# Patient Record
Sex: Female | Born: 1990 | Race: White | Hispanic: No | Marital: Married | Smoking: Never smoker
Health system: Southern US, Community
[De-identification: ages and names within clinical notes are randomized; demographics above are authoritative.]

## PROBLEM LIST (undated history)

## (undated) ENCOUNTER — Inpatient Hospital Stay: Payer: Self-pay

## (undated) DIAGNOSIS — K219 Gastro-esophageal reflux disease without esophagitis: Secondary | ICD-10-CM

## (undated) DIAGNOSIS — F419 Anxiety disorder, unspecified: Secondary | ICD-10-CM

## (undated) DIAGNOSIS — F32A Depression, unspecified: Secondary | ICD-10-CM

## (undated) DIAGNOSIS — F329 Major depressive disorder, single episode, unspecified: Secondary | ICD-10-CM

## (undated) HISTORY — DX: Gastro-esophageal reflux disease without esophagitis: K21.9

## (undated) HISTORY — PX: NO PAST SURGERIES: SHX2092

## (undated) HISTORY — DX: Depression, unspecified: F32.A

## (undated) HISTORY — DX: Major depressive disorder, single episode, unspecified: F32.9

## (undated) HISTORY — DX: Anxiety disorder, unspecified: F41.9

## (undated) NOTE — ED Provider Notes (Signed)
 Formatting of this note is different from the original. EMERGENCY DEPARTMENT ENCOUNTER    CHIEF COMPLAINT   No chief complaint on file.  HPI   Janet Rivera is a 46 y.o. female who presents for alcohol intoxication.  Patient was out drinking at 7 mild post tonight with her family and took her evening medications and had a lot of nausea vomiting this evening upon return home.  Per EMS symptoms patient got home she started vomiting and was very drowsy therefore EMS was called.  PAST MEDICAL HISTORY   No past medical history on file.  SURGICAL HISTORY   No past surgical history on file.  CURRENT MEDICATIONS   No current facility-administered medications for this encounter.   No current outpatient medications on file.   ALLERGIES   Not on File  FAMILY HISTORY   No family history on file.  SOCIAL HISTORY   Social History   Socioeconomic History  ? Marital status: Not on file    Spouse name: Not on file  ? Number of children: Not on file  ? Years of education: Not on file  ? Highest education level: Not on file  Occupational History  ? Not on file  Tobacco Use  ? Smoking status: Not on file  ? Smokeless tobacco: Not on file  Substance and Sexual Activity  ? Alcohol use: Not on file  ? Drug use: Not on file  ? Sexual activity: Not on file  Other Topics Concern  ? Not on file  Social History Narrative  ? Not on file   Social Determinants of Health   Financial Resource Strain: Not on file  Food Insecurity: Not on file  Transportation Needs: Not on file  Social Connections: Not on file  Housing Stability: Not on file   REVIEW OF SYSTEMS   All systems reviewed and negative except for those mentioned above See HPI for further details.  PHYSICAL EXAM   VITAL SIGNS: BP 119/79 (BP Location: Right upper arm, Patient Position: Semi- fowlers)   Pulse 90   Resp 18   SpO2 100%  Constitutional:  Well developed, well nourished, no acute distress, non-toxic appearance.    Eyes:  PERRLA, EOMI,  conjunctiva and sclera clear, no discharge HENT: Atraumatic, Normocephalic, oral pharynx pink and moist, nares clear bilaterally  Neck:Normal inspection, normal range of motion, supple, no meningeal signs, no lymphadenopathy Respiratory:  Normal breath sounds, no respiratory distress, no wheezing, no chest tenderness.  Cardiovascular:  Normal heart rate, normal rhythm, no murmurs, no rubs, no gallops.  Abdomen:Normal abdomen exam, Bowel sounds normal, Soft, No tenderness, No masses, No pulsatile masses. No rebound or organomegly. Extremities:  Intact distal pulses, no edema, no tenderness, no cyanosis, no clubbing. Good range of motion in all major joints. No tenderness to palpation or major deformities noted.  Back: No midline or CVA tenderness.  Skin:  Warm, dry, no erythema, no rash.  Neurologic:  Awake and alert, gross normal cognition No focal deficits noted,  CN II-XII  Intact as tested  ED COURSE & MEDICAL DECISION MAKING   Pertinent labs & imaging studies reviewed. (See chart for details)  Patient is nontoxic having no respiratory depression.  Patient given fluids antiemetics.  Patient discharged home once clinically sober with sober driver.  She has had no trauma.  She is neurologically intact. Please note that this clinical encounter occurred during the global COVID-19 pandemic.  Clinical decision-making and criteria for admission, discharge and certain testing has necessarily changed given the  strain on the healthcare system.  In addition, current treatment and isolation precautions and directives are actively evolving as more information is being learned about this specific virus.  This may have an impact on treatment and clinical decision making specifically related to suspected viral infections.    CLINICAL IMPRESSION   1. Alcohol intoxication   Portions of this note may be dictated using Dragon Naturally Speaking voice recognition software.  Variances in  spelling and vocabulary are possible and unintentional.  Not all errors are caught/corrected.  Please notify the dino if any discrepancies are noted or if the meaning of any statement is not clear.  Morene RAMAN. Walden, DO 04/05/22 0330  Electronically signed by Morene RAMAN. Wiles, DO at 04/05/2022  3:30 AM EST

---

## 2015-01-17 DIAGNOSIS — F32A Depression, unspecified: Secondary | ICD-10-CM | POA: Insufficient documentation

## 2015-01-17 DIAGNOSIS — F329 Major depressive disorder, single episode, unspecified: Secondary | ICD-10-CM | POA: Insufficient documentation

## 2015-07-04 DIAGNOSIS — F411 Generalized anxiety disorder: Secondary | ICD-10-CM | POA: Insufficient documentation

## 2016-04-13 NOTE — L&D Delivery Note (Signed)
0345 In room to see patient, reports pelvic pressure with the urge to push. SVE: 10/100/+2, vertex. Effective maternal pushing efforts noted.  Spontaneous vaginal delivery of liveborn female patient at 65 in compound presentation. Infant immediately to maternal abdomen. Delayed cord clamping. Three (3) vessel cord. Cord blood collected. APGARS: 8, 9. Weight pending. Receiving nurse at bedside for birth.  Spontaneous delivery of placenta at 0453. Pitocin infusing. Left labial laceration and first degree perineal laceration repaired with 3-0 vicryl rapide under epidural anesthesia, hemostatic. Uterus firm with massage. 800 mcg cytotec placed rectally. Uterus firm. Rubra moderate. Vault check completed. Counts correct x 2. EBL: 500 ml.   Initiate routine postpartum care and orders. Mom to postpartum.  Baby to Couplet care / Skin to Skin.  FOB present at bedside for birth.    Gunnar Bulla, CNM 12/26/2016, 5:21 AM

## 2016-04-17 ENCOUNTER — Ambulatory Visit (INDEPENDENT_AMBULATORY_CARE_PROVIDER_SITE_OTHER): Payer: BLUE CROSS/BLUE SHIELD | Admitting: Certified Nurse Midwife

## 2016-04-17 ENCOUNTER — Encounter: Payer: Self-pay | Admitting: Certified Nurse Midwife

## 2016-04-17 VITALS — BP 146/87 | HR 97 | Ht 62.0 in | Wt 165.2 lb

## 2016-04-17 DIAGNOSIS — K259 Gastric ulcer, unspecified as acute or chronic, without hemorrhage or perforation: Secondary | ICD-10-CM | POA: Insufficient documentation

## 2016-04-17 DIAGNOSIS — N912 Amenorrhea, unspecified: Secondary | ICD-10-CM | POA: Insufficient documentation

## 2016-04-17 DIAGNOSIS — Z7689 Persons encountering health services in other specified circumstances: Secondary | ICD-10-CM | POA: Diagnosis not present

## 2016-04-17 DIAGNOSIS — Z0189 Encounter for other specified special examinations: Secondary | ICD-10-CM

## 2016-04-17 NOTE — Progress Notes (Signed)
OB/GYN  NOTE:  Subjective:       Janet Rivera is a 26 y.o. G0P0000 female who presents for a second opinion and to establish care.   Last week, [name] [facility]. Both her urine pregnancy test and blood work were negative. A transvaginal US was performed since she reported missed menses and abdominal cramping.   Her US showed enlarged ovaries with several follicles and she was diagnosed with PCOS and referred to an infertility specialist.   After leaving the office, the patient began to research online and presents today requesting further evaluation and lab work. She also spoke with her mother who has PCOS. [name] vehemently denies all symptoms of hyperandrogenism that are associated with PCOS.    Gynecologic History Patient's last menstrual period was 02/24/2016 (exact date).   Contraception: none; Stopped OCP on 03/09/2016.  Obstetric History OB History  Gravida Para Term Preterm AB Living  0 0 0 0 0 0  SAB TAB Ectopic Multiple Live Births  0 0 0 0 0        Past Medical History:  Diagnosis Date  . Anxiety   . Depression     History reviewed. No pertinent surgical history.  No current outpatient prescriptions on file prior to visit.   No current facility-administered medications on file prior to visit.     Not on File  Social History   Social History  . Marital status: Married    Spouse name: N/A  . Number of children: N/A  . Years of education: N/A   Occupational History  . Not on file.   Social History Main Topics  . Smoking status: Never Smoker  . Smokeless tobacco: Never Used  . Alcohol use No  . Drug use: No  . Sexual activity: Yes    Birth control/ protection: None   Other Topics Concern  . Not on file   Social History Narrative  . No narrative on file    Family History  Problem Relation Age of Onset  . Thyroid disease Mother   . Rheum arthritis Father   . Diabetes Father   . Diabetes Maternal  Grandmother   . Breast cancer Paternal Grandmother   . Rheum arthritis Paternal Grandfather     The following portions of the patient's history were reviewed and updated as appropriate: allergies, current medications, past family history, past medical history, past social history, past surgical history and problem list.  Review of Systems Constitutional: negative Respiratory: negative Cardiovascular: negative Gastrointestinal: negative Genitourinary:negative   Objective:   BP (!) 146/87   Pulse 97   Ht 5\' 2"  (1.575 m)   Wt 165 lb 3 oz (74.9 kg)   LMP 02/24/2016 (Exact Date)   BMI 30.21 kg/m   Alert and oriented x 4  PE not indicated  Assessment   1. Encounter to establish care  2. Encounter for laboratory test  Plan:   1. Schedule fasting lab work in one (1) week  2. Schedule results review visit in two (2) weeks   Gunnar BullaJenkins Michelle Crystal Ellwood, CNM

## 2016-04-17 NOTE — Progress Notes (Signed)
Pt is here for a second opinion on dx of PCOS from [facility] 1 week ago. Ultrasound done.

## 2016-04-20 ENCOUNTER — Other Ambulatory Visit: Payer: BLUE CROSS/BLUE SHIELD

## 2016-04-20 DIAGNOSIS — Z0189 Encounter for other specified special examinations: Secondary | ICD-10-CM

## 2016-04-20 DIAGNOSIS — Z7689 Persons encountering health services in other specified circumstances: Secondary | ICD-10-CM

## 2016-04-21 ENCOUNTER — Telehealth: Payer: Self-pay | Admitting: Certified Nurse Midwife

## 2016-04-21 ENCOUNTER — Other Ambulatory Visit: Payer: Self-pay | Admitting: Certified Nurse Midwife

## 2016-04-21 DIAGNOSIS — N912 Amenorrhea, unspecified: Secondary | ICD-10-CM

## 2016-04-21 LAB — COMPREHENSIVE METABOLIC PANEL
ALBUMIN: 4.3 g/dL (ref 3.5–5.5)
ALK PHOS: 82 IU/L (ref 39–117)
ALT: 26 IU/L (ref 0–32)
AST: 25 IU/L (ref 0–40)
Albumin/Globulin Ratio: 1.5 (ref 1.2–2.2)
BUN/Creatinine Ratio: 17 (ref 9–23)
BUN: 12 mg/dL (ref 6–20)
Bilirubin Total: 0.3 mg/dL (ref 0.0–1.2)
CO2: 21 mmol/L (ref 18–29)
CREATININE: 0.7 mg/dL (ref 0.57–1.00)
Calcium: 9.2 mg/dL (ref 8.7–10.2)
Chloride: 100 mmol/L (ref 96–106)
GFR calc Af Amer: 139 mL/min/{1.73_m2} (ref >59)
GFR calc non Af Amer: 121 mL/min/{1.73_m2} (ref >59)
GLUCOSE: 99 mg/dL (ref 65–99)
Globulin, Total: 2.9 g/dL (ref 1.5–4.5)
Potassium: 3.7 mmol/L (ref 3.5–5.2)
Sodium: 138 mmol/L (ref 134–144)
Total Protein: 7.2 g/dL (ref 6.0–8.5)

## 2016-04-21 LAB — SPECIMEN STATUS

## 2016-04-21 LAB — PROLACTIN: PROLACTIN: 16.3 ng/mL (ref 4.8–23.3)

## 2016-04-21 LAB — TESTOSTERONE, FREE, TOTAL, SHBG
SEX HORMONE BINDING: 76.5 nmol/L (ref 24.6–122.0)
Testosterone, Free: 1.7 pg/mL (ref 0.0–4.2)
Testosterone: 54 ng/dL — ABNORMAL HIGH (ref 8–48)

## 2016-04-21 LAB — LIPID PANEL
CHOL/HDL RATIO: 2.4 ratio (ref 0.0–4.4)
Cholesterol, Total: 175 mg/dL (ref 100–199)
HDL: 74 mg/dL (ref >39)
LDL Calculated: 83 mg/dL (ref 0–99)
Triglycerides: 90 mg/dL (ref 0–149)
VLDL Cholesterol Cal: 18 mg/dL (ref 5–40)

## 2016-04-21 LAB — TSH: TSH: 2.97 u[IU]/mL (ref 0.450–4.500)

## 2016-04-21 LAB — BETA HCG QUANT (REF LAB): HCG QUANT: 117 m[IU]/mL

## 2016-04-21 LAB — FSH/LH: FSH: 0.6 m[IU]/mL

## 2016-04-21 LAB — DHEA-SULFATE: DHEA-SO4: 148 ug/dL (ref 84.8–378.0)

## 2016-04-21 LAB — INSULIN, RANDOM: INSULIN: 33 u[IU]/mL — AB (ref 2.6–24.9)

## 2016-04-21 NOTE — Telephone Encounter (Signed)
Called pt, full name and date of birth verified. Notified patient of positive Beta HCG and need for repeat draw Wednesday, 04/22/2016. Pt verbalized understanding.    Gunnar BullaJenkins Michelle Lynn Recendiz, CNM

## 2016-04-22 ENCOUNTER — Other Ambulatory Visit: Payer: BLUE CROSS/BLUE SHIELD

## 2016-04-22 DIAGNOSIS — N912 Amenorrhea, unspecified: Secondary | ICD-10-CM

## 2016-04-23 ENCOUNTER — Telehealth: Payer: Self-pay | Admitting: Certified Nurse Midwife

## 2016-04-23 DIAGNOSIS — N912 Amenorrhea, unspecified: Secondary | ICD-10-CM

## 2016-04-23 LAB — BETA HCG QUANT (REF LAB): hCG Quant: 284 m[IU]/mL

## 2016-04-23 NOTE — Telephone Encounter (Signed)
Called pt, verified full name and date of birth.   Lab results given.   Beta HCG=284, previously 117. Repeat Friday. Pt verbalized understanding.    Gunnar BullaJenkins Michelle Shunte Senseney, CNM

## 2016-04-24 ENCOUNTER — Other Ambulatory Visit: Payer: BLUE CROSS/BLUE SHIELD

## 2016-04-24 DIAGNOSIS — N912 Amenorrhea, unspecified: Secondary | ICD-10-CM

## 2016-04-25 LAB — BETA HCG QUANT (REF LAB): hCG Quant: 762 m[IU]/mL

## 2016-04-27 ENCOUNTER — Encounter: Payer: BLUE CROSS/BLUE SHIELD | Admitting: Certified Nurse Midwife

## 2016-04-27 ENCOUNTER — Other Ambulatory Visit: Payer: Self-pay | Admitting: Certified Nurse Midwife

## 2016-04-27 ENCOUNTER — Ambulatory Visit (INDEPENDENT_AMBULATORY_CARE_PROVIDER_SITE_OTHER): Payer: BLUE CROSS/BLUE SHIELD | Admitting: Certified Nurse Midwife

## 2016-04-27 ENCOUNTER — Encounter: Payer: Self-pay | Admitting: Certified Nurse Midwife

## 2016-04-27 VITALS — BP 127/80 | HR 91 | Ht 62.0 in | Wt 170.0 lb

## 2016-04-27 DIAGNOSIS — Z3201 Encounter for pregnancy test, result positive: Secondary | ICD-10-CM | POA: Diagnosis not present

## 2016-04-27 DIAGNOSIS — N926 Irregular menstruation, unspecified: Secondary | ICD-10-CM

## 2016-04-27 LAB — POCT URINALYSIS DIPSTICK
Glucose, UA: NEGATIVE
PH UA: 5
Protein, UA: NEGATIVE
Spec Grav, UA: 1.025

## 2016-04-27 LAB — POCT URINE PREGNANCY: PREG TEST UR: POSITIVE — AB

## 2016-04-27 MED ORDER — VENLAFAXINE HCL ER 75 MG PO CP24
75.0000 mg | ORAL_CAPSULE | Freq: Every day | ORAL | 0 refills | Status: DC
Start: 2016-04-27 — End: 2016-05-06

## 2016-04-27 MED ORDER — VENLAFAXINE HCL ER 37.5 MG PO CP24: 37.5000 mg | ORAL_CAPSULE | Freq: Every day | ORAL | 0 refills | Status: DC

## 2016-04-27 NOTE — Progress Notes (Signed)
OB/GYN CONFERENCE NOTE:  Subjective:       Janet Rivera is a 26 y.o. G0P0000 female who presents for a conference appointment to review lab results. Her mother is also in attendance.   [name] originally presented for a second opinion on her PCOS diagnosis. Lab work was ordered and a positive Beta HCG was found.  She reports increased hunger, nausea, bloating, and breast tenderness. Denies difficulty breathing or respiratory distress, chest pain, abdominal pain, vaginal bleeding, and leg pain or swelling.   Pt currently taking Effexor 150 mg for depression and anxiety, questions safety in pregnancy.    Gynecologic History Patient's last menstrual period was 02/24/2016 (exact date).   Contraception: none  Obstetric History OB History  Gravida Para Term Preterm AB Living  0 0 0 0 0 0  SAB TAB Ectopic Multiple Live Births  0 0 0 0 0        Past Medical History:  Diagnosis Date  . Anxiety   . Depression     History reviewed. No pertinent surgical history.  Current Outpatient Prescriptions on File Prior to Visit  Medication Sig Dispense Refill  . LORazepam (ATIVAN) 0.5 MG tablet Take by mouth.    . pantoprazole (PROTONIX) 40 MG tablet Take by mouth.    . venlafaxine XR (EFFEXOR-XR) 150 MG 24 hr capsule Take by mouth.     No current facility-administered medications on file prior to visit.     Not on File  Social History   Social History  . Marital status: Married    Spouse name: N/A  . Number of children: N/A  . Years of education: N/A   Occupational History  . Not on file.   Social History Main Topics  . Smoking status: Never Smoker  . Smokeless tobacco: Never Used  . Alcohol use No  . Drug use: No  . Sexual activity: Yes    Birth control/ protection: None   Other Topics Concern  . Not on file   Social History Narrative  . No narrative on file    Family History  Problem Relation Age of Onset  . Thyroid disease Mother   . Rheum arthritis  Father   . Diabetes Father   . Diabetes Maternal Grandmother   . Breast cancer Paternal Grandmother   . Rheum arthritis Paternal Grandfather     The following portions of the patient's history were reviewed and updated as appropriate: allergies, current medications, past family history, past medical history, past social history, past surgical history and problem list.  Review of Systems Pertinent items are noted in HPI.   Objective:   BP 127/80   Pulse 91   Ht 5\' 2"  (1.575 m)   Wt 170 lb (77.1 kg)   LMP 02/24/2016 (Exact Date)   BMI 31.09 kg/m   Positive UPT  Assessment/Plan:   Patient Active Problem List   Diagnosis Date Noted  . Amenorrhea 04/17/2016  . Ulcer, stomach peptic 04/17/2016  . Anxiety state 07/04/2015  . Depression 01/17/2015        1. Lab results reviewed, consistent with pregnancy and PCOS  2. Beta HCG then f/u US for dating  3. New OB Packet given  4. Reviewed red flag symptoms and reasons to call  5. Effexor tapering schedule given, pt verbalized understanding.    Gunnar BullaJenkins Michelle Dailan Pfalzgraf, CNM

## 2016-04-27 NOTE — Progress Notes (Signed)
Pt states she is here to review labs.

## 2016-04-28 ENCOUNTER — Ambulatory Visit (INDEPENDENT_AMBULATORY_CARE_PROVIDER_SITE_OTHER): Payer: BLUE CROSS/BLUE SHIELD

## 2016-04-28 ENCOUNTER — Other Ambulatory Visit: Payer: Self-pay | Admitting: Certified Nurse Midwife

## 2016-04-28 ENCOUNTER — Telehealth: Payer: Self-pay | Admitting: Certified Nurse Midwife

## 2016-04-28 DIAGNOSIS — N926 Irregular menstruation, unspecified: Secondary | ICD-10-CM

## 2016-04-28 LAB — BETA HCG QUANT (REF LAB): hCG Quant: 2607 m[IU]/mL

## 2016-04-28 NOTE — Telephone Encounter (Signed)
PT WAS HERE YESTERDAY FOR SOME BLOOD WORK AND SHE WAS WANTING TO KNOW THE RESULTS IF THEY WERE BACK.

## 2016-04-28 NOTE — Telephone Encounter (Signed)
Spoke with pt. Results and instructions given.

## 2016-04-28 NOTE — Telephone Encounter (Signed)
Please advise 

## 2016-04-28 NOTE — Telephone Encounter (Signed)
Please let her know that her results are back, her Beta was 2607. She should schedule an US for viability and dating and then we will decide on Nurse Intake meeting afterwards.   Thanks, JML

## 2016-05-01 ENCOUNTER — Other Ambulatory Visit: Payer: Self-pay | Admitting: Certified Nurse Midwife

## 2016-05-01 DIAGNOSIS — Z369 Encounter for antenatal screening, unspecified: Secondary | ICD-10-CM

## 2016-05-05 ENCOUNTER — Encounter: Payer: Self-pay | Admitting: Certified Nurse Midwife

## 2016-05-05 ENCOUNTER — Other Ambulatory Visit: Payer: Self-pay | Admitting: Certified Nurse Midwife

## 2016-05-06 ENCOUNTER — Telehealth: Payer: Self-pay

## 2016-05-06 ENCOUNTER — Other Ambulatory Visit: Payer: Self-pay | Admitting: Certified Nurse Midwife

## 2016-05-06 MED ORDER — VENLAFAXINE HCL ER 37.5 MG PO CP24: 37.5000 mg | ORAL_CAPSULE | Freq: Every day | ORAL | 0 refills | Status: DC

## 2016-05-06 MED ORDER — VENLAFAXINE HCL ER 75 MG PO CP24: 75.0000 mg | ORAL_CAPSULE | Freq: Every day | ORAL | 0 refills | Status: DC

## 2016-05-06 NOTE — Telephone Encounter (Signed)
Janet DusterMichelle, this pt is calling because she was under the impression that the taper dose of effexor was being sent to her pharmacy, I looked in the chart and it looks like you ordered the medications but pended the orders rather than send them. Can you please go back in and send the medications (it won't let me as you have the orders pending) or if you want the pt to hold off on taking the medication please advise.

## 2016-05-06 NOTE — Addendum Note (Signed)
Addended by: Shaune SpittleLAWHORN, Sanay Belmar M on: 05/06/2016 12:46 PM   Modules accepted: Orders

## 2016-05-07 ENCOUNTER — Encounter: Payer: Self-pay | Admitting: Obstetrics and Gynecology

## 2016-05-07 ENCOUNTER — Encounter: Payer: Self-pay | Admitting: Certified Nurse Midwife

## 2016-05-07 ENCOUNTER — Telehealth: Payer: Self-pay | Admitting: Certified Nurse Midwife

## 2016-05-07 ENCOUNTER — Other Ambulatory Visit: Payer: Self-pay

## 2016-05-07 DIAGNOSIS — F329 Major depressive disorder, single episode, unspecified: Secondary | ICD-10-CM

## 2016-05-07 DIAGNOSIS — F419 Anxiety disorder, unspecified: Principal | ICD-10-CM

## 2016-05-07 MED ORDER — SERTRALINE HCL 50 MG PO TABS: 50.0000 mg | ORAL_TABLET | Freq: Every day | ORAL | 11 refills | Status: DC

## 2016-05-07 NOTE — Telephone Encounter (Signed)
Called pt, verified full name and date of birth.   Reports increased anxiety and moodiness with Effexor tapering. Denies suicidal or homicidal ideations.   Reviewed revamp tapering schedule:   Week 1: Effexor 75 mg and Zoloft 50 mg daily  Week 2: Effexor 37.5 mg and Zoloft 50 mg daily  Week 3: Effexor 37.5 mg every other day and Zoloft 50 mg daily  Week 4: Zoloft 50 mg daily  Pt to disregard MyChart message from Dorita Fray. [place], CMA.  Encouraged finding a talk therapist during pregnancy.   Advised immediate ED evaluation for suicidal or homicidal ideations.   Call with any further needs, questions or concerns.    Gunnar BullaJenkins Michelle Arneda Sappington, CNM

## 2016-05-07 NOTE — Telephone Encounter (Signed)
Please call in Zoloft 50 mg for the patient. Also, would you see if she has or would like to see a talk therapist?  Thanks, JML

## 2016-05-08 ENCOUNTER — Telehealth: Payer: Self-pay | Admitting: Certified Nurse Midwife

## 2016-05-08 DIAGNOSIS — O219 Vomiting of pregnancy, unspecified: Secondary | ICD-10-CM

## 2016-05-08 MED ORDER — DOXYLAMINE-PYRIDOXINE 10-10 MG PO TBEC: DELAYED_RELEASE_TABLET | ORAL | 3 refills | Status: DC

## 2016-05-08 NOTE — Telephone Encounter (Signed)
Called pt she states that she is having trouble controlling nausea and vomiting. PT states that she has tried ginger tea, an old rx for Zofran that she had at home as well as saltines. Advised pt on eaiting crackers before leaving bed in the am, also advised on Unisom and vitamin b-6. RX sent in for diclegis advised pt that this would likely require prior auth, pt gave verbal understanding.

## 2016-05-08 NOTE — Telephone Encounter (Signed)
Can you check on this? Who gave her Zofran? Thanks, JML

## 2016-05-08 NOTE — Telephone Encounter (Signed)
Pt called and she wanted to know if something else could be called in for her morning sickness, she was already given Zofran but its not working. She would like it sent to the [facility] in [place] if it can be called in.

## 2016-05-12 ENCOUNTER — Telehealth: Payer: Self-pay | Admitting: Certified Nurse Midwife

## 2016-05-12 NOTE — Telephone Encounter (Signed)
Advised pt on Friday that she should take vitamin B6 25mg  tid and Unisom 10mg  nightly while we were awaiting the prior auth of Diclegis advised pt at that time that this was a lengthy process. (see mychart message)   Sent pt a mychart message today reiterating that the prior Berkley Harveyauth is being addressed, however does take time especially as the rx was initially sent on a Friday and the weekend adds more time. Also questioned pt as to whether or not she has tried the B6 and Unisom without relief. If she has with no relief she needs to make us aware as she may need a different medication altogether.

## 2016-05-12 NOTE — Telephone Encounter (Signed)
Okie, is this the patient you contacted about morning sickness? JML

## 2016-05-12 NOTE — Telephone Encounter (Signed)
Pt called and a prior auth needed to be done for the diclegis and she hasn't heard anything about her RX being ready from pharmacy nor the prio auth, so she is checking to see if its been done. Not sure what nurse to send this to since a different nurse works with you and I have no idea when the prior auth was sent it, so I sent to you.

## 2016-05-13 ENCOUNTER — Ambulatory Visit (INDEPENDENT_AMBULATORY_CARE_PROVIDER_SITE_OTHER): Payer: BLUE CROSS/BLUE SHIELD

## 2016-05-13 DIAGNOSIS — Z369 Encounter for antenatal screening, unspecified: Secondary | ICD-10-CM

## 2016-05-15 ENCOUNTER — Other Ambulatory Visit: Payer: BLUE CROSS/BLUE SHIELD

## 2016-05-18 ENCOUNTER — Ambulatory Visit (INDEPENDENT_AMBULATORY_CARE_PROVIDER_SITE_OTHER): Payer: BLUE CROSS/BLUE SHIELD | Admitting: Certified Nurse Midwife

## 2016-05-18 ENCOUNTER — Other Ambulatory Visit: Payer: Self-pay | Admitting: Certified Nurse Midwife

## 2016-05-18 ENCOUNTER — Telehealth: Payer: Self-pay

## 2016-05-18 VITALS — BP 137/82 | HR 92 | Ht 63.0 in | Wt 177.1 lb

## 2016-05-18 DIAGNOSIS — O219 Vomiting of pregnancy, unspecified: Secondary | ICD-10-CM

## 2016-05-18 DIAGNOSIS — Z113 Encounter for screening for infections with a predominantly sexual mode of transmission: Secondary | ICD-10-CM

## 2016-05-18 DIAGNOSIS — T7589XA Other specified effects of external causes, initial encounter: Secondary | ICD-10-CM

## 2016-05-18 DIAGNOSIS — Z3401 Encounter for supervision of normal first pregnancy, first trimester: Secondary | ICD-10-CM

## 2016-05-18 DIAGNOSIS — Z1389 Encounter for screening for other disorder: Secondary | ICD-10-CM

## 2016-05-18 MED ORDER — PROMETHAZINE HCL 25 MG PO TABS: 25.0000 mg | ORAL_TABLET | Freq: Four times a day (QID) | ORAL | 1 refills | Status: DC | PRN

## 2016-05-18 NOTE — Telephone Encounter (Signed)
Pt aware diclegis denied. Pt has tried b6 and unison x 1 weeks mild relief. Per JML will send in phenergan 25 mg q 6prn. Pt is to let us know if this doesn't help, we will send in phenergan rectally.

## 2016-05-18 NOTE — Patient Instructions (Signed)
Morning Sickness Morning sickness is when you feel sick to your stomach (nauseous) during pregnancy. You may feel sick to your stomach and throw up (vomit). You may feel sick in the morning, but you can feel this way any time of day. Some women feel very sick to their stomach and cannot stop throwing up (hyperemesis gravidarum). Follow these instructions at home:  Only take medicines as told by your doctor.  Take multivitamins as told by your doctor. Taking multivitamins before getting pregnant can stop or lessen the harshness of morning sickness.  Eat dry toast or unsalted crackers before getting out of bed.  Eat 5 to 6 small meals a day.  Eat dry and bland foods like rice and baked potatoes.  Do not drink liquids with meals. Drink between meals.  Do not eat greasy, fatty, or spicy foods.  Have someone cook for you if the smell of food causes you to feel sick or throw up.  If you feel sick to your stomach after taking prenatal vitamins, take them at night or with a snack.  Eat protein when you need a snack (nuts, yogurt, cheese).  Eat unsweetened gelatins for dessert.  Wear a bracelet used for sea sickness (acupressure wristband).  Go to a doctor that puts thin needles into certain body points (acupuncture) to improve how you feel.  Do not smoke.  Use a humidifier to keep the air in your house free of odors.  Get lots of fresh air. Contact a doctor if:  You need medicine to feel better.  You feel dizzy or lightheaded.  You are losing weight. Get help right away if:  You feel very sick to your stomach and cannot stop throwing up.  You pass out (faint). This information is not intended to replace advice given to you by your health care provider. Make sure you discuss any questions you have with your health care provider. Document Released: 05/07/2004 Document Revised: 09/05/2015 Document Reviewed: 09/14/2012 Elsevier Interactive Patient Education  2017 Elsevier  Inc. Minor Illnesses and Medications in Pregnancy  Cold/Flu:  Sudafed for congestion- Robitussin (plain) for cough- Tylenol for discomfort.  Please follow the directions on the label.  Try not to take any more than needed.  OTC Saline nasal spray and air humidifier or cool-mist  Vaporizer to sooth nasal irritation and to loosen congestion.  It is also important to increase intake of non carbonated fluids, especially if you have a fever.  Constipation:  Colace-2 capsules at bedtime; Metamucil- follow directions on label; Senokot- 1 tablet at bedtime.  Any one of these medications can be used.  It is also very important to increase fluids and fruits along with regular exercise.  If problem persists please call the office.  Diarrhea:  Kaopectate as directed on the label.  Eat a bland diet and increase fluids.  Avoid highly seasoned foods.  Headache:  Tylenol 1 or 2 tablets every 3-4 hours as needed  Indigestion:  Maalox, Mylanta, Tums or Rolaids- as directed on label.  Also try to eat small meals and avoid fatty, greasy or spicy foods.  Nausea with or without Vomiting:  Nausea in pregnancy is caused by increased levels of hormones in the body which influence the digestive system and cause irritation when stomach acids accumulate.  Symptoms usually subside after 1st trimester of pregnancy.  Try the following: 1. Keep saltines, graham crackers or dry toast by your bed to eat upon awakening. 2. Don't let your stomach get empty.  Try to  eat 5-6 small meals per day instead of 3 large ones. 3. Avoid greasy fatty or highly seasoned foods.  4. Take OTC Unisom 1 tablet at bed time along with OTC Vitamin B6 25-50 mg 3 times per day.    If nausea continues with vomiting and you are unable to keep down food and fluids you may need a prescription medication.  Please notify your provider.   Sore throat:  Chloraseptic spray, throat lozenges and or plain Tylenol.  Vaginal Yeast Infection:  OTC Monistat for 7  days as directed on label.  If symptoms do not resolve within a week notify provider.  If any of the above problems do not subside with recommended treatment please call the office for further assistance.   Do not take Aspirin, Advil, Motrin or Ibuprofen.  * * OTC= Over the counter First Trimester of Pregnancy The first trimester of pregnancy is from week 1 until the end of week 12 (months 1 through 3). A week after a sperm fertilizes an egg, the egg will implant on the wall of the uterus. This embryo will begin to develop into a baby. Genes from you and your partner are forming the baby. The female genes determine whether the baby is a boy or a girl. At 6-8 weeks, the eyes and face are formed, and the heartbeat can be seen on ultrasound. At the end of 12 weeks, all the baby's organs are formed.  Now that you are pregnant, you will want to do everything you can to have a healthy baby. Two of the most important things are to get good prenatal care and to follow your health care provider's instructions. Prenatal care is all the medical care you receive before the baby's birth. This care will help prevent, find, and treat any problems during the pregnancy and childbirth. BODY CHANGES Your body goes through many changes during pregnancy. The changes vary from woman to woman.   You may gain or lose a couple of pounds at first.  You may feel sick to your stomach (nauseous) and throw up (vomit). If the vomiting is uncontrollable, call your health care provider.  You may tire easily.  You may develop headaches that can be relieved by medicines approved by your health care provider.  You may urinate more often. Painful urination may mean you have a bladder infection.  You may develop heartburn as a result of your pregnancy.  You may develop constipation because certain hormones are causing the muscles that push waste through your intestines to slow down.  You may develop hemorrhoids or swollen, bulging  veins (varicose veins).  Your breasts may begin to grow larger and become tender. Your nipples may stick out more, and the tissue that surrounds them (areola) may become darker.  Your gums may bleed and may be sensitive to brushing and flossing.  Dark spots or blotches (chloasma, mask of pregnancy) may develop on your face. This will likely fade after the baby is born.  Your menstrual periods will stop.  You may have a loss of appetite.  You may develop cravings for certain kinds of food.  You may have changes in your emotions from day to day, such as being excited to be pregnant or being concerned that something may go wrong with the pregnancy and baby.  You may have more vivid and strange dreams.  You may have changes in your hair. These can include thickening of your hair, rapid growth, and changes in texture. Some women also  have hair loss during or after pregnancy, or hair that feels dry or thin. Your hair will most likely return to normal after your baby is born. WHAT TO EXPECT AT YOUR PRENATAL VISITS During a routine prenatal visit:  You will be weighed to make sure you and the baby are growing normally.  Your blood pressure will be taken.  Your abdomen will be measured to track your baby's growth.  The fetal heartbeat will be listened to starting around week 10 or 12 of your pregnancy.  Test results from any previous visits will be discussed. Your health care provider may ask you:  How you are feeling.  If you are feeling the baby move.  If you have had any abnormal symptoms, such as leaking fluid, bleeding, severe headaches, or abdominal cramping.  If you are using any tobacco products, including cigarettes, chewing tobacco, and electronic cigarettes.  If you have any questions. Other tests that may be performed during your first trimester include:  Blood tests to find your blood type and to check for the presence of any previous infections. They will also be used  to check for low iron levels (anemia) and Rh antibodies. Later in the pregnancy, blood tests for diabetes will be done along with other tests if problems develop.  Urine tests to check for infections, diabetes, or protein in the urine.  An ultrasound to confirm the proper growth and development of the baby.  An amniocentesis to check for possible genetic problems.  Fetal screens for spina bifida and Down syndrome.  You may need other tests to make sure you and the baby are doing well.  HIV (human immunodeficiency virus) testing. Routine prenatal testing includes screening for HIV, unless you choose not to have this test. HOME CARE INSTRUCTIONS  Medicines   Follow your health care provider's instructions regarding medicine use. Specific medicines may be either safe or unsafe to take during pregnancy.  Take your prenatal vitamins as directed.  If you develop constipation, try taking a stool softener if your health care provider approves. Diet   Eat regular, well-balanced meals. Choose a variety of foods, such as meat or vegetable-based protein, fish, milk and low-fat dairy products, vegetables, fruits, and whole grain breads and cereals. Your health care provider will help you determine the amount of weight gain that is right for you.  Avoid raw meat and uncooked cheese. These carry germs that can cause birth defects in the baby.  Eating four or five small meals rather than three large meals a day may help relieve nausea and vomiting. If you start to feel nauseous, eating a few soda crackers can be helpful. Drinking liquids between meals instead of during meals also seems to help nausea and vomiting.  If you develop constipation, eat more high-fiber foods, such as fresh vegetables or fruit and whole grains. Drink enough fluids to keep your urine clear or pale yellow. Activity and Exercise   Exercise only as directed by your health care provider. Exercising will help you:  Control your  weight.  Stay in shape.  Be prepared for labor and delivery.  Experiencing pain or cramping in the lower abdomen or low back is a good sign that you should stop exercising. Check with your health care provider before continuing normal exercises.  Try to avoid standing for long periods of time. Move your legs often if you must stand in one place for a long time.  Avoid heavy lifting.  Wear low-heeled shoes, and practice good  posture.  You may continue to have sex unless your health care provider directs you otherwise. Relief of Pain or Discomfort   Wear a good support bra for breast tenderness.   Take warm sitz baths to soothe any pain or discomfort caused by hemorrhoids. Use hemorrhoid cream if your health care provider approves.   Rest with your legs elevated if you have leg cramps or low back pain.  If you develop varicose veins in your legs, wear support hose. Elevate your feet for 15 minutes, 3-4 times a day. Limit salt in your diet. Prenatal Care   Schedule your prenatal visits by the twelfth week of pregnancy. They are usually scheduled monthly at first, then more often in the last 2 months before delivery.  Write down your questions. Take them to your prenatal visits.  Keep all your prenatal visits as directed by your health care provider. Safety   Wear your seat belt at all times when driving.  Make a list of emergency phone numbers, including numbers for family, friends, the hospital, and police and fire departments. General Tips   Ask your health care provider for a referral to a local prenatal education class. Begin classes no later than at the beginning of month 6 of your pregnancy.  Ask for help if you have counseling or nutritional needs during pregnancy. Your health care provider can offer advice or refer you to specialists for help with various needs.  Do not use hot tubs, steam rooms, or saunas.  Do not douche or use tampons or scented sanitary  pads.  Do not cross your legs for long periods of time.  Avoid cat litter boxes and soil used by cats. These carry germs that can cause birth defects in the baby and possibly loss of the fetus by miscarriage or stillbirth.  Avoid all smoking, herbs, alcohol, and medicines not prescribed by your health care provider. Chemicals in these affect the formation and growth of the baby.  Do not use any tobacco products, including cigarettes, chewing tobacco, and electronic cigarettes. If you need help quitting, ask your health care provider. You may receive counseling support and other resources to help you quit.  Schedule a dentist appointment. At home, brush your teeth with a soft toothbrush and be gentle when you floss. SEEK MEDICAL CARE IF:   You have dizziness.  You have mild pelvic cramps, pelvic pressure, or nagging pain in the abdominal area.  You have persistent nausea, vomiting, or diarrhea.  You have a bad smelling vaginal discharge.  You have pain with urination.  You notice increased swelling in your face, hands, legs, or ankles. SEEK IMMEDIATE MEDICAL CARE IF:   You have a fever.  You are leaking fluid from your vagina.  You have spotting or bleeding from your vagina.  You have severe abdominal cramping or pain.  You have rapid weight gain or loss.  You vomit blood or material that looks like coffee grounds.  You are exposed to [place] measles and have never had them.  You are exposed to fifth disease or chickenpox.  You develop a severe headache.  You have shortness of breath.  You have any kind of trauma, such as from a fall or a car accident. This information is not intended to replace advice given to you by your health care provider. Make sure you discuss any questions you have with your health care provider. Document Released: 03/24/2001 Document Revised: 04/20/2014 Document Reviewed: 02/07/2013 Elsevier Interactive Patient Education  2017 Elsevier  Inc. How a Baby Grows During Pregnancy Introduction Pregnancy begins when a female's sperm enters a female's egg (fertilization). This happens in one of the tubes (fallopian tubes) that connect the ovaries to the womb (uterus). The fertilized egg is called an embryo until it reaches 10 weeks. From 10 weeks until birth, it is called a fetus. The fertilized egg moves down the fallopian tube to the uterus. Then it implants into the lining of the uterus and begins to grow. The developing fetus receives oxygen and nutrients through the pregnant woman's bloodstream and the tissues that grow (placenta) to support the fetus. The placenta is the life support system for the fetus. It provides nutrition and removes waste. Learning as much as you can about your pregnancy and how your baby is developing can help you enjoy the experience. It can also make you aware of when there might be a problem and when to ask questions. How long does a typical pregnancy last? A pregnancy usually lasts 280 days, or about 40 weeks. Pregnancy is divided into three trimesters:  First trimester: 0-13 weeks.  Second trimester: 14-27 weeks.  Third trimester: 28-40 weeks. The day when your baby is considered ready to be born (full term) is your estimated date of delivery. How does my baby develop month by month? First month  The fertilized egg attaches to the inside of the uterus.  Some cells will form the placenta. Others will form the fetus.  The arms, legs, brain, spinal cord, lungs, and heart begin to develop.  At the end of the first month, the heart begins to beat. Second month  The bones, inner ear, eyelids, hands, and feet form.  The genitals develop.  By the end of 8 weeks, all major organs are developing. Third month  All of the internal organs are forming.  Teeth develop below the gums.  Bones and muscles begin to grow. The spine can flex.  The skin is transparent.  Fingernails and toenails begin to  form.  Arms and legs continue to grow longer, and hands and feet develop.  The fetus is about 3 in (7.6 cm) long. Fourth month  The placenta is completely formed.  The external sex organs, neck, outer ear, eyebrows, eyelids, and fingernails are formed.  The fetus can hear, swallow, and move its arms and legs.  The kidneys begin to produce urine.  The skin is covered with a white waxy coating (vernix) and very fine hair (lanugo). Fifth month  The fetus moves around more and can be felt for the first time (quickening).  The fetus starts to sleep and wake up and may begin to suck its finger.  The nails grow to the end of the fingers.  The organ in the digestive system that makes bile (gallbladder) functions and helps to digest the nutrients.  If your baby is a girl, eggs are present in her ovaries. If your baby is a boy, testicles start to move down into his scrotum. Sixth month  The lungs are formed, but the fetus is not yet able to breathe.  The eyes open. The brain continues to develop.  Your baby has fingerprints and toe prints. Your baby's hair grows thicker.  At the end of the second trimester, the fetus is about 9 in (22.9 cm) long. Seventh month  The fetus kicks and stretches.  The eyes are developed enough to sense changes in light.  The hands can make a grasping motion.  The fetus responds to sound. Eighth  month  All organs and body systems are fully developed and functioning.  Bones harden and taste buds develop. The fetus may hiccup.  Certain areas of the brain are still developing. The skull remains soft. Ninth month  The fetus gains about  lb (0.23 kg) each week.  The lungs are fully developed.  Patterns of sleep develop.  The fetus's head typically moves into a head-down position (vertex) in the uterus to prepare for birth. If the buttocks move into a vertex position instead, the baby is breech.  The fetus weighs 6-9 lbs (2.72-4.08 kg) and is  19-20 in (48.26-50.8 cm) long. What can I do to have a healthy pregnancy and help my baby develop?  Eating and Drinking  Eat a healthy diet.  Talk with your health care provider to make sure that you are getting the nutrients that you and your baby need.  Visit www.[employer].be to learn about creating a healthy diet.  Gain a healthy amount of weight during pregnancy as advised by your health care provider. This is usually 25-35 pounds. You may need to:  Gain more if you were underweight before getting pregnant or if you are pregnant with more than one baby.  Gain less if you were overweight or obese when you got pregnant. Medicines and Vitamins  Take prenatal vitamins as directed by your health care provider. These include vitamins such as folic acid, iron, calcium, and vitamin D. They are important for healthy development.  Take medicines only as directed by your health care provider. Read labels and ask a pharmacist or your health care provider whether over-the-counter medicines, supplements, and prescription drugs are safe to take during pregnancy. Activities  Be physically active as advised by your health care provider. Ask your health care provider to recommend activities that are safe for you to do, such as walking or swimming.  Do not participate in strenuous or extreme sports.  Lifestyle  Do not drink alcohol.  Do not use any tobacco products, including cigarettes, chewing tobacco, or electronic cigarettes. If you need help quitting, ask your health care provider.  Do not use illegal drugs. Safety  Avoid exposure to mercury, lead, or other heavy metals. Ask your health care provider about common sources of these heavy metals.  Avoid listeria infection during pregnancy. Follow these precautions:  Do not eat soft cheeses or deli meats.  Do not eat hot dogs unless they have been warmed up to the point of steaming, such as in the microwave oven.  Do not drink  unpasteurized milk.  Avoid toxoplasmosis infection during pregnancy. Follow these precautions:  Do not change your cat's litter box, if you have a cat. Ask someone else to do this for you.  Wear gardening gloves while working in the yard. General Instructions  Keep all follow-up visits as directed by your health care provider. This is important. This includes prenatal care and screening tests.  Manage any chronic health conditions. Work closely with your health care provider to keep conditions, such as diabetes, under control. How do I know if my baby is developing well? At each prenatal visit, your health care provider will do several different tests to check on your health and keep track of your baby's development. These include:  Fundal height.  Your health care provider will measure your growing belly from top to bottom using a tape measure.  Your health care provider will also feel your belly to determine your baby's position.  Heartbeat.  An ultrasound in the  first trimester can confirm pregnancy and show a heartbeat, depending on how far along you are.  Your health care provider will check your baby's heart rate at every prenatal visit.  As you get closer to your delivery date, you may have regular fetal heart rate monitoring to make sure that your baby is not in distress.  Second trimester ultrasound.  This ultrasound checks your baby's development. It also indicates your baby's gender. What should I do if I have concerns about my baby's development? Always talk with your health care provider about any concerns that you may have. This information is not intended to replace advice given to you by your health care provider. Make sure you discuss any questions you have with your health care provider. Document Released: 09/16/2007 Document Revised: 09/05/2015 Document Reviewed: 09/06/2013  2017 Elsevier Commonly Asked Questions During Pregnancy  Cats: A parasite can be  excreted in cat feces.  To avoid exposure you need to have another person empty the little box.  If you must empty the litter box you will need to wear gloves.  Wash your hands after handling your cat.  This parasite can also be found in raw or undercooked meat so this should also be avoided.  Colds, Sore Throats, Flu: Please check your medication sheet to see what you can take for symptoms.  If your symptoms are unrelieved by these medications please call the office.  Dental Work: Most any dental work Agricultural consultant recommends is permitted.  X-rays should only be taken during the first trimester if absolutely necessary.  Your abdomen should be shielded with a lead apron during all x-rays.  Please notify your provider prior to receiving any x-rays.  Novocaine is fine; gas is not recommended.  If your dentist requires a note from [place] prior to dental work please call the office and we will provide one for you.  Exercise: Exercise is an important part of staying healthy during your pregnancy.  You may continue most exercises you were accustomed to prior to pregnancy.  Later in your pregnancy you will most likely notice you have difficulty with activities requiring balance like riding a bicycle.  It is important that you listen to your body and avoid activities that put you at a higher risk of falling.  Adequate rest and staying well hydrated are a must!  If you have questions about the safety of specific activities ask your provider.    Exposure to Children with illness: Try to avoid obvious exposure; report any symptoms to [place] when noted,  If you have chicken pos, red measles or mumps, you should be immune to these diseases.   Please do not take any vaccines while pregnant unless you have checked with your OB provider.  Fetal Movement: After 28 weeks we recommend you do "kick counts" twice daily.  Lie or sit down in a calm quiet environment and count your baby movements "kicks".  You should feel your baby at  least 10 times per hour.  If you have not felt 10 kicks within the first hour get up, walk around and have something sweet to eat or drink then repeat for an additional hour.  If count remains less than 10 per hour notify your provider.  Fumigating: Follow your pest control agent's advice as to how long to stay out of your home.  Ventilate the area well before re-entering.  Hemorrhoids:   Most over-the-counter preparations can be used during pregnancy.  Check your medication to see what  is safe to use.  It is important to use a stool softener or fiber in your diet and to drink lots of liquids.  If hemorrhoids seem to be getting worse please call the office.   Hot Tubs:  Hot tubs Jacuzzis and saunas are not recommended while pregnant.  These increase your internal body temperature and should be avoided.  Intercourse:  Sexual intercourse is safe during pregnancy as long as you are comfortable, unless otherwise advised by your provider.  Spotting may occur after intercourse; report any bright red bleeding that is heavier than spotting.  Labor:  If you know that you are in labor, please go to the hospital.  If you are unsure, please call the office and let us help you decide what to do.  Lifting, straining, etc:  If your job requires heavy lifting or straining please check with your provider for any limitations.  Generally, you should not lift items heavier than that you can lift simply with your hands and arms (no back muscles)  Painting:  Paint fumes do not harm your pregnancy, but may make you ill and should be avoided if possible.  Latex or water based paints have less odor than oils.  Use adequate ventilation while painting.  Permanents & Hair Color:  Chemicals in hair dyes are not recommended as they cause increase hair dryness which can increase hair loss during pregnancy.  " Highlighting" and permanents are allowed.  Dye may be absorbed differently and permanents may not hold as well during  pregnancy.  Sunbathing:  Use a sunscreen, as skin burns easily during pregnancy.  Drink plenty of fluids; avoid over heating.  Tanning Beds:  Because their possible side effects are still unknown, tanning beds are not recommended.  Ultrasound Scans:  Routine ultrasounds are performed at approximately 20 weeks.  You will be able to see your baby's general anatomy an if you would like to know the gender this can usually be determined as well.  If it is questionable when you conceived you may also receive an ultrasound early in your pregnancy for dating purposes.  Otherwise ultrasound exams are not routinely performed unless there is a medical necessity.  Although you can request a scan we ask that you pay for it when conducted because insurance does not cover " patient request" scans.  Work: If your pregnancy proceeds without complications you may work until your due date, unless your physician or employer advises otherwise.  Round Ligament Pain/Pelvic Discomfort:  Sharp, shooting pains not associated with bleeding are fairly common, usually occurring in the second trimester of pregnancy.  They tend to be worse when standing up or when you remain standing for long periods of time.  These are the result of pressure of certain pelvic ligaments called "round ligaments".  Rest, Tylenol and heat seem to be the most effective relief.  As the womb and fetus grow, they rise out of the pelvis and the discomfort improves.  Please notify the office if your pain seems different than that described.  It may represent a more serious condition.

## 2016-05-18 NOTE — Progress Notes (Signed)
Janet JewsVictoria Rivera presents for NOB nurse interview visit. Pregnancy confirmation done  04/17/2016 with JML.  Dating scan on 05/13/2016 showed IUP at  7 1/7 with edd 12/29/2016.  G1.   P0- . EGA- 7 6/7  Pregnancy education material explained and given. __Pos_ cats in the home. Advised to NOT change litter box. Toxoplasma ordered. NOB labs ordered.  HIV labs and Drug screen were ordered.  PNV encouraged. Genetic screening  to discuss with provider. Pt. To follow up with JML in _4_ weeks for NOB physical. Pt states mom has h/o of pre eclampsia both pregnancies. Last pap 2016 wnl. No h/o abn. Flu vaccine utd. Pt having nausea and vomiting. More nausea. Diclegis denied per her insurance. B6 and unisom not helping. Per JML will send in Phenergan 25mg  q 6.  All questions answered.

## 2016-05-19 LAB — CBC WITH DIFFERENTIAL/PLATELET
Basophils Absolute: 0 10*3/uL (ref 0.0–0.2)
Basos: 1 %
EOS (ABSOLUTE): 0.1 10*3/uL (ref 0.0–0.4)
EOS: 1 %
Hematocrit: 38.6 % (ref 34.0–46.6)
Hemoglobin: 12.6 g/dL (ref 11.1–15.9)
IMMATURE GRANULOCYTES: 0 %
Immature Grans (Abs): 0 10*3/uL (ref 0.0–0.1)
LYMPHS: 31 %
Lymphocytes Absolute: 2.3 10*3/uL (ref 0.7–3.1)
MCH: 29.4 pg (ref 26.6–33.0)
MCHC: 32.6 g/dL (ref 31.5–35.7)
MCV: 90 fL (ref 79–97)
MONOS ABS: 0.7 10*3/uL (ref 0.1–0.9)
Monocytes: 9 %
NEUTROS PCT: 58 %
Neutrophils Absolute: 4.3 10*3/uL (ref 1.4–7.0)
PLATELETS: 299 10*3/uL (ref 150–379)
RBC: 4.29 x10E6/uL (ref 3.77–5.28)
RDW: 13.9 % (ref 12.3–15.4)
WBC: 7.5 10*3/uL (ref 3.4–10.8)

## 2016-05-19 LAB — URINALYSIS, ROUTINE W REFLEX MICROSCOPIC
Bilirubin, UA: NEGATIVE
Glucose, UA: NEGATIVE
Ketones, UA: NEGATIVE
Nitrite, UA: NEGATIVE
PH UA: 6 (ref 5.0–7.5)
PROTEIN UA: NEGATIVE
RBC, UA: NEGATIVE
Specific Gravity, UA: 1.009 (ref 1.005–1.030)
Urobilinogen, Ur: 0.2 mg/dL (ref 0.2–1.0)

## 2016-05-19 LAB — MONITOR DRUG PROFILE 14(MW)
AMPHETAMINE SCREEN URINE: NEGATIVE ng/mL
BARBITURATE SCREEN URINE: NEGATIVE ng/mL
BENZODIAZEPINE SCREEN, URINE: NEGATIVE ng/mL
Buprenorphine, Urine: NEGATIVE ng/mL
CANNABINOIDS UR QL SCN: NEGATIVE ng/mL
CREATININE(CRT), U: 45.6 mg/dL (ref 20.0–300.0)
Cocaine (Metab) Scrn, Ur: NEGATIVE ng/mL
Fentanyl, Urine: NEGATIVE pg/mL
Meperidine Screen, Urine: NEGATIVE ng/mL
Methadone Screen, Urine: NEGATIVE ng/mL
OXYCODONE+OXYMORPHONE UR QL SCN: NEGATIVE ng/mL
Opiate Scrn, Ur: NEGATIVE ng/mL
Ph of Urine: 5.5 (ref 4.5–8.9)
Phencyclidine Qn, Ur: NEGATIVE ng/mL
Propoxyphene Scrn, Ur: NEGATIVE ng/mL
SPECIFIC GRAVITY: 1.008
Tramadol Screen, Urine: NEGATIVE ng/mL

## 2016-05-19 LAB — MICROSCOPIC EXAMINATION: CASTS: NONE SEEN /LPF

## 2016-05-19 LAB — ANTIBODY SCREEN: Antibody Screen: NEGATIVE

## 2016-05-19 LAB — HIV ANTIBODY (ROUTINE TESTING W REFLEX): HIV SCREEN 4TH GENERATION: NONREACTIVE

## 2016-05-19 LAB — TOXOPLASMA ANTIBODIES- IGG AND  IGM: Toxoplasma IgG Ratio: <3 IU/mL (ref 0.0–7.1)

## 2016-05-19 LAB — NICOTINE SCREEN, URINE: COTININE UR QL SCN: NEGATIVE ng/mL

## 2016-05-19 LAB — VARICELLA ZOSTER ANTIBODY, IGG: VARICELLA: 1417 {index} (ref >165)

## 2016-05-19 LAB — RH TYPE: RH TYPE: POSITIVE

## 2016-05-19 LAB — HEPATITIS B SURFACE ANTIGEN: HEP B S AG: NEGATIVE

## 2016-05-19 LAB — ABO

## 2016-05-19 LAB — RUBELLA SCREEN: RUBELLA: 1.73 {index} (ref >0.99)

## 2016-05-19 LAB — RPR: RPR: NONREACTIVE

## 2016-05-20 LAB — URINE CULTURE: Organism ID, Bacteria: NO GROWTH

## 2016-05-20 LAB — GC/CHLAMYDIA PROBE AMP
Chlamydia trachomatis, NAA: NEGATIVE
NEISSERIA GONORRHOEAE BY PCR: NEGATIVE

## 2016-06-04 ENCOUNTER — Encounter: Payer: BLUE CROSS/BLUE SHIELD | Admitting: Certified Nurse Midwife

## 2016-06-09 ENCOUNTER — Other Ambulatory Visit: Payer: Self-pay

## 2016-06-09 ENCOUNTER — Encounter: Payer: Self-pay | Admitting: Certified Nurse Midwife

## 2016-06-09 ENCOUNTER — Ambulatory Visit (INDEPENDENT_AMBULATORY_CARE_PROVIDER_SITE_OTHER): Payer: BLUE CROSS/BLUE SHIELD | Admitting: Certified Nurse Midwife

## 2016-06-09 VITALS — BP 142/82 | HR 87 | Wt 178.8 lb

## 2016-06-09 DIAGNOSIS — Z3401 Encounter for supervision of normal first pregnancy, first trimester: Secondary | ICD-10-CM

## 2016-06-09 DIAGNOSIS — Z1389 Encounter for screening for other disorder: Secondary | ICD-10-CM

## 2016-06-09 NOTE — Progress Notes (Signed)
Wants to do Panorama genetic screen.

## 2016-06-09 NOTE — Progress Notes (Signed)
NEW OB HISTORY AND PHYSICAL  SUBJECTIVE:       Janet Rivera is a 26 y.o. [id number] female, Patient's last menstrual period was 03/12/2016 (exact date)., Estimated Date of Delivery: 12/29/16, [id number], presents today for establishment of Prenatal Care.  She reports nausea that is improving as pregnancy progresses.   [name] has successful weaned from Effexor and transitioned to Zoloft 50 mg daily. She reports "feeling good".   Denies difficulty breathing or respiratory distress, chest pain, abdominal pain, vaginal bleeding, and leg pain or swelling.   Desires genetic screening, requests Panorama.    Gynecologic History  Patient's last menstrual period was 03/12/2016 (exact date).  Last Pap: 06/25/2013. Results were: normal  Obstetric History OB History  Gravida Para Term Preterm AB Living  1 0 0 0 0 0  SAB TAB Ectopic Multiple Live Births  0 0 0 0 0    # Outcome Date GA Lbr Len/2nd Weight Sex Delivery Anes PTL Lv  1 Current               Past Medical History:  Diagnosis Date  . Acid reflux   . Anxiety   . Depression   . Nausea/vomiting in pregnancy     Past Surgical History:  Procedure Laterality Date  . NO PAST SURGERIES      Current Outpatient Prescriptions on File Prior to Visit  Medication Sig Dispense Refill  . pantoprazole (PROTONIX) 40 MG tablet Take by mouth.    . Prenatal Vit-Fe Fumarate-FA (PRENATAL MULTIVITAMIN) TABS tablet Take 1 tablet by mouth daily at 12 noon.    . promethazine (PHENERGAN) 25 MG tablet Take 1 tablet (25 mg total) by mouth every 6 (six) hours as needed for nausea or vomiting. 30 tablet 1  . pyridOXINE (VITAMIN B-6) 25 MG tablet Take 25 mg by mouth daily.    . sertraline (ZOLOFT) 50 MG tablet Take 1 tablet (50 mg total) by mouth daily. 30 tablet 11  . venlafaxine (EFFEXOR) 75 MG tablet Take 75 mg by mouth 2 (two) times daily.     No current facility-administered medications on file prior to visit.     Not on File  Social History    Social History  . Marital status: Married    Spouse name: N/A  . Number of children: N/A  . Years of education: N/A   Occupational History  . Not on file.   Social History Main Topics  . Smoking status: Never Smoker  . Smokeless tobacco: Never Used  . Alcohol use No  . Drug use: No  . Sexual activity: Yes    Birth control/ protection: None   Other Topics Concern  . Not on file   Social History Narrative  . No narrative on file    Family History  Problem Relation Age of Onset  . Thyroid disease Mother   . Rheum arthritis Father   . Diabetes Father   . Diabetes Maternal Grandmother   . Breast cancer Paternal Grandmother   . Rheum arthritis Paternal Grandfather     The following portions of the patient's history were reviewed and updated as appropriate: allergies, current medications, past OB history, past medical history, past surgical history, past family history, past social history, and problem list.    OBJECTIVE: Initial Physical Exam (New OB)  GENERAL APPEARANCE: alert, well appearing, in no apparent distress  HEAD: normocephalic, atraumatic  MOUTH: mucous membranes moist, pharynx normal without lesions and dental hygiene good  THYROID: no thyromegaly or masses present  BREASTS: no masses noted, no significant tenderness, no palpable axillary nodes, no skin changes  LUNGS: clear to auscultation, no wheezes, rales or rhonchi, symmetric air entry  HEART: regular rate and rhythm, no murmurs  ABDOMEN: soft, nontender, nondistended, no abnormal masses, no epigastric pain, fundus not palpable and FHT present  EXTREMITIES: no redness or tenderness in the calves or thighs, no edema  SKIN: normal coloration and turgor, no rashes, 15 professional tattoos  LYMPH NODES: no adenopathy palpable  NEUROLOGIC: alert, oriented, normal speech, no focal findings or movement disorder noted  PELVIC EXAM EXTERNAL GENITALIA: normal appearing vulva with no masses,  tenderness or lesions VAGINA: no abnormal discharge or lesions CERVIX: no lesions or cervical motion tenderness UTERUS: gravid and consistent with 11 weeks ADNEXA: no masses palpable and nontender  ASSESSMENT: Normal pregnancy Desires genetic screening  PLAN: Prenatal care PAP completed Panorama today Encouraged prenatal yoga; continue Zoloft 50 mg daily Reviewed red flag symptoms and when to call RTC x 4 weeks for [name] See orders   Gunnar Bulla, CNM

## 2016-06-09 NOTE — Patient Instructions (Signed)
Second Trimester of Pregnancy The second trimester is from week 13 through week 28, month 4 through 6. This is often the time in pregnancy that you feel your best. Often times, morning sickness has lessened or quit. You may have more energy, and you may get hungry more often. Your unborn baby (fetus) is growing rapidly. At the end of the sixth month, he or she is about 9 inches long and weighs about 1 pounds. You will likely feel the baby move (quickening) between 18 and 20 weeks of pregnancy. Follow these instructions at home:  Avoid all smoking, herbs, and alcohol. Avoid drugs not approved by your doctor.  Do not use any tobacco products, including cigarettes, chewing tobacco, and electronic cigarettes. If you need help quitting, ask your doctor. You may get counseling or other support to help you quit.  Only take medicine as told by your doctor. Some medicines are safe and some are not during pregnancy.  Exercise only as told by your doctor. Stop exercising if you start having cramps.  Eat regular, healthy meals.  Wear a good support bra if your breasts are tender.  Do not use hot tubs, steam rooms, or saunas.  Wear your seat belt when driving.  Avoid raw meat, uncooked cheese, and liter boxes and soil used by cats.  Take your prenatal vitamins.  Take 1500-2000 milligrams of calcium daily starting at the 20th week of pregnancy until you deliver your baby.  Try taking medicine that helps you poop (stool softener) as needed, and if your doctor approves. Eat more fiber by eating fresh fruit, vegetables, and whole grains. Drink enough fluids to keep your pee (urine) clear or pale yellow.  Take warm water baths (sitz baths) to soothe pain or discomfort caused by hemorrhoids. Use hemorrhoid cream if your doctor approves.  If you have puffy, bulging veins (varicose veins), wear support hose. Raise (elevate) your feet for 15 minutes, 3-4 times a day. Limit salt in your diet.  Avoid heavy  lifting, wear low heals, and sit up straight.  Rest with your legs raised if you have leg cramps or low back pain.  Visit your dentist if you have not gone during your pregnancy. Use a soft toothbrush to brush your teeth. Be gentle when you floss.  You can have sex (intercourse) unless your doctor tells you not to.  Go to your doctor visits. Get help if:  You feel dizzy.  You have mild cramps or pressure in your lower belly (abdomen).  You have a nagging pain in your belly area.  You continue to feel sick to your stomach (nauseous), throw up (vomit), or have watery poop (diarrhea).  You have bad smelling fluid coming from your vagina.  You have pain with peeing (urination). Get help right away if:  You have a fever.  You are leaking fluid from your vagina.  You have spotting or bleeding from your vagina.  You have severe belly cramping or pain.  You lose or gain weight rapidly.  You have trouble catching your breath and have chest pain.  You notice sudden or extreme puffiness (swelling) of your face, hands, ankles, feet, or legs.  You have not felt the baby move in over an hour.  You have severe headaches that do not go away with medicine.  You have vision changes. This information is not intended to replace advice given to you by your health care provider. Make sure you discuss any questions you have with your health care   provider. Document Released: 06/24/2009 Document Revised: 09/05/2015 Document Reviewed: 05/31/2012 Elsevier Interactive Patient Education  2017 [employer]  

## 2016-06-11 LAB — CYTOLOGY - PAP

## 2016-06-15 ENCOUNTER — Encounter: Payer: BLUE CROSS/BLUE SHIELD | Admitting: Certified Nurse Midwife

## 2016-06-16 ENCOUNTER — Encounter: Payer: Self-pay | Admitting: Certified Nurse Midwife

## 2016-06-16 ENCOUNTER — Telehealth: Payer: Self-pay | Admitting: Certified Nurse Midwife

## 2016-06-16 NOTE — Telephone Encounter (Signed)
Pt called and informed that panorama genetic test was low risk and that she is having a female. Also informed that pap results were normal.

## 2016-06-16 NOTE — Telephone Encounter (Signed)
Pt called again for results teri

## 2016-06-16 NOTE — Telephone Encounter (Signed)
Pt called wanting the results of her Panorama. Please advise.

## 2016-06-19 ENCOUNTER — Telehealth: Payer: Self-pay | Admitting: Certified Nurse Midwife

## 2016-06-19 MED ORDER — PROMETHAZINE HCL 25 MG PO TABS: 25.0000 mg | ORAL_TABLET | Freq: Four times a day (QID) | ORAL | 1 refills | Status: DC | PRN

## 2016-06-19 NOTE — Telephone Encounter (Signed)
[name] 12 3/7 c/o nausea and vomiting. Had phenergan at 5am. Vomited x 2 this am. Urinated x 1 since 7am. Needs refill of phenergan. Pt states with phenergan she is able to hold liquids and not vomit. Refill given. Pt c/o being sleepy while taking phenergan. Advised b6 25mg  during the day. Advised Gatorade, Pedialyte, or  Popsicles. Eat small frequent meals. NO fried foods. Pt aware to contact office if she is unable to hold liquids for greater than 6 hours she is to contact office or go to the ER.  Pt voices understanding.

## 2016-06-19 NOTE — Telephone Encounter (Signed)
Patient called stating she has been feeling dizzy and nauseous for a few days. She also cant keep anything down. She is [redacted] weeks pregnant.

## 2016-07-07 ENCOUNTER — Ambulatory Visit (INDEPENDENT_AMBULATORY_CARE_PROVIDER_SITE_OTHER): Payer: BLUE CROSS/BLUE SHIELD | Admitting: Obstetrics and Gynecology

## 2016-07-07 VITALS — BP 112/76 | HR 83 | Wt 179.4 lb

## 2016-07-07 DIAGNOSIS — Z3492 Encounter for supervision of normal pregnancy, unspecified, second trimester: Secondary | ICD-10-CM

## 2016-07-07 NOTE — Progress Notes (Signed)
[name]- pt is doing well 

## 2016-07-07 NOTE — Patient Instructions (Signed)
Heartburn During Pregnancy Heartburn is a type of pain or discomfort that can happen in the throat or chest. It is often described as a burning sensation. Heartburn is common during pregnancy because:  A hormone (progesterone) that is released during pregnancy may relax the valve (lower esophageal sphincter, or LES) that separates the esophagus from the stomach. This allows stomach acid to move up into the esophagus, causing heartburn.  The uterus gets larger and pushes up on the stomach, which pushes more acid into the esophagus. This is especially true in the later stages of pregnancy. Heartburn usually goes away or gets better after giving birth. What are the causes? Heartburn is caused by stomach acid backing up into the esophagus (reflux). Reflux can be triggered by:  Changing hormone levels.  Large meals.  Certain foods and beverages, such as coffee, chocolate, onions, and peppermint.  Exercise.  Increased stomach acid production. What increases the risk? You are more likely to experience heartburn during pregnancy if you:  Had heartburn prior to becoming pregnant.  Have been pregnant more than once before.  Are overweight or obese. The likelihood that you will get heartburn also increases as you get farther along in your pregnancy, especially during the last trimester. What are the signs or symptoms? Symptoms of this condition include:  Burning pain in the chest or lower throat.  Bitter taste in the mouth.  Coughing.  Problems swallowing.  Vomiting.  Hoarse voice.  Asthma. Symptoms may get worse when you lie down or bend over. Symptoms are often worse at night. How is this diagnosed? This condition is diagnosed based on:  Your medical history.  Your symptoms.  Blood tests to check for a certain type of bacteria associated with heartburn.  Whether taking heartburn medicine relieves your symptoms.  Examination of the stomach and esophagus using a tube with  a light and camera on the end (endoscopy). How is this treated? Treatment varies depending on how severe your symptoms are. Your health care provider may recommend:  Over-the-counter medicines (antacids or acid reducers) for mild heartburn.  Prescription medicines to decrease stomach acid or to protect your stomach lining.  Certain changes in your diet.  Raising the head of your bed so it is higher than the foot of the bed. This helps prevent stomach acid from backing up into the esophagus when you are lying down. Follow these instructions at home: Eating and drinking  Do not drink alcohol during your pregnancy.  Identify foods and beverages that make your symptoms worse, and avoid them.  Beverages that you may want to avoid include:  Coffee and tea (with or without caffeine).  Energy drinks and sports drinks.  Carbonated drinks or sodas.  Citrus fruit juices.  Foods that you may want to avoid include:  Chocolate and cocoa.  Peppermint and mint flavorings.  Garlic, onions, and horseradish.  Spicy and acidic foods, including peppers, chili powder, curry powder, vinegar, hot sauces, and barbecue sauce.  Citrus fruits, such as oranges, lemons, and limes.  Tomato-based foods, such as red sauce, chili, and salsa.  Fried and fatty foods, such as donuts, french fries, potato chips, and high-fat dressings.  High-fat meats, such as hot dogs, cold cuts, sausage, ham, and bacon.  High-fat dairy items, such as whole milk, butter, and cheese.  Eat small, frequent meals instead of large meals.  Avoid drinking large amounts of liquid with your meals.  Avoid eating meals during the 2-3 hours before bedtime.  Avoid lying down right   after you eat.  Do not exercise right after you eat. Medicines  Take over-the-counter and prescription medicines only as told by your health care provider.  Do not take aspirin, ibuprofen, or other NSAIDs unless your health care provider tells  you to do that.  You may be instructed to avoid medicines that contain sodium bicarbonate. General instructions  If directed, raise the head of your bed about 6 inches (15 cm) by putting blocks under the legs. Sleeping with more pillows does not effectively relieve heartburn because it only changes the position of your head.  Do not use any products that contain nicotine or tobacco, such as cigarettes and e-cigarettes. If you need help quitting, ask your health care provider.  Wear loose-fitting clothing.  Try to reduce your stress, such as with yoga or meditation. If you need help managing stress, ask your health care provider.  Maintain a healthy weight. If you are overweight, work with your health care provider to safely lose weight.  Keep all follow-up visits as told by your health care provider. This is important. Contact a health care provider if:  You develop new symptoms.  Your symptoms do not improve with treatment.  You have unexplained weight loss.  You have difficulty swallowing.  You make loud sounds when you breathe (wheeze).  You have a cough that does not go away.  You have frequent heartburn for more than 2 weeks.  You have nausea or vomiting that does not get better with treatment.  You have pain in your abdomen. Get help right away if:  You have severe chest pain that spreads to your arm, neck, or jaw.  You feel sweaty, dizzy, or light-headed.  You have shortness of breath.  You have pain when swallowing.  You vomit, and your vomit looks like blood or coffee grounds.  Your stool is bloody or black. This information is not intended to replace advice given to you by your health care provider. Make sure you discuss any questions you have with your health care provider. Document Released: 03/27/2000 Document Revised: 12/16/2015 Document Reviewed: 12/16/2015 Elsevier Interactive Patient Education  2017 [employer]  

## 2016-07-07 NOTE — Progress Notes (Signed)
[name]-reviewed genetic screen,anatomy scan.

## 2016-07-11 ENCOUNTER — Encounter: Payer: Self-pay | Admitting: Certified Nurse Midwife

## 2016-07-27 ENCOUNTER — Ambulatory Visit (INDEPENDENT_AMBULATORY_CARE_PROVIDER_SITE_OTHER): Payer: BLUE CROSS/BLUE SHIELD | Admitting: Certified Nurse Midwife

## 2016-07-27 ENCOUNTER — Encounter: Payer: BLUE CROSS/BLUE SHIELD | Admitting: Certified Nurse Midwife

## 2016-07-27 ENCOUNTER — Other Ambulatory Visit: Payer: Self-pay | Admitting: Certified Nurse Midwife

## 2016-07-27 ENCOUNTER — Encounter: Payer: Self-pay | Admitting: Certified Nurse Midwife

## 2016-07-27 VITALS — BP 123/67 | HR 89 | Wt 181.7 lb

## 2016-07-27 DIAGNOSIS — O99342 Other mental disorders complicating pregnancy, second trimester: Secondary | ICD-10-CM

## 2016-07-27 DIAGNOSIS — Z3492 Encounter for supervision of normal pregnancy, unspecified, second trimester: Secondary | ICD-10-CM | POA: Diagnosis not present

## 2016-07-27 DIAGNOSIS — O2202 Varicose veins of lower extremity in pregnancy, second trimester: Secondary | ICD-10-CM | POA: Diagnosis not present

## 2016-07-27 DIAGNOSIS — F419 Anxiety disorder, unspecified: Secondary | ICD-10-CM | POA: Diagnosis not present

## 2016-07-27 LAB — POCT URINALYSIS DIPSTICK
Bilirubin, UA: NEGATIVE
GLUCOSE UA: NEGATIVE
Ketones, UA: NEGATIVE
NITRITE UA: NEGATIVE
Protein, UA: NEGATIVE
Spec Grav, UA: 1.015 (ref 1.010–1.025)
Urobilinogen, UA: 0.2 E.U./dL
pH, UA: 6.5 (ref 5.0–8.0)

## 2016-07-27 MED ORDER — SERTRALINE HCL 100 MG PO TABS: 100.0000 mg | ORAL_TABLET | Freq: Every day | ORAL | 0 refills | Status: DC

## 2016-07-27 NOTE — Patient Instructions (Addendum)
Hypertension During Pregnancy Hypertension is also called high blood pressure. High blood pressure means that the force of your blood moving in your body is too strong. When you are pregnant, this condition should be watched carefully. It can cause problems for you and your baby. Follow these instructions at home: Eating and drinking   Drink enough fluid to keep your pee (urine) clear or pale yellow.  Eat healthy foods that are low in salt (sodium).  Do not add salt to your food.  Check labels on foods and drinks to see much salt is in them. Look on the label where you see "Sodium." Lifestyle   Do not use any products that contain nicotine or tobacco, such as cigarettes and e-cigarettes. If you need help quitting, ask your doctor.  Do not use alcohol.  Avoid caffeine.  Avoid stress. Rest and get plenty of sleep. General instructions   Take over-the-counter and prescription medicines only as told by your doctor.  While lying down, lie on your left side. This keeps pressure off your baby.  While sitting or lying down, raise (elevate) your feet. Try putting some pillows under your lower legs.  Exercise regularly. Ask your doctor what kinds of exercise are best for you.  Keep all prenatal and follow-up visits as told by your doctor. This is important. Contact a doctor if:  You have symptoms that your doctor told you to watch for, such as:  Fever.  Throwing up (vomiting).  Headache. Get help right away if:  You have very bad pain in your belly (abdomen).  You are throwing up, and this does not get better with treatment.  You suddenly get swelling in your hands, ankles, or face.  You gain 4 lb (1.8 kg) or more in 1 week.  You get bleeding from your vagina.  You have blood in your pee.  You do not feel your baby moving as much as normal.  You have a change in vision.  You have muscle twitching or sudden tightening (spasms).  You have trouble breathing.  Your  lips or fingernails turn blue. This information is not intended to replace advice given to you by your health care provider. Make sure you discuss any questions you have with your health care provider. Document Released: 05/02/2010 Document Revised: 12/10/2015 Document Reviewed: 12/10/2015 Elsevier Interactive Patient Education  2017 Else[employer] Inc.  Varicose Veins Varicose veins are veins that have become enlarged and twisted. They are usually seen in the legs but can occur in other parts of the body as well. What are the causes? This condition is the result of valves in the veins not working properly. Valves in the veins help to return blood from the leg to the heart. If these valves are damaged, blood flows backward and backs up into the veins in the leg near the skin. This causes the veins to become larger. What increases the risk? People who are on their feet a lot, who are pregnant, or who are overweight are more likely to develop varicose veins. What are the signs or symptoms?  Bulging, twisted-appearing, bluish veins, most commonly found on the legs.  Leg pain or a feeling of heaviness. These symptoms may be worse at the end of the day.  Leg swelling.  Changes in skin color. How is this diagnosed? A health care provider can usually diagnose varicose veins by examining your legs. Your health care provider may also recommend an ultrasound of your leg veins. How is this treated? Most  varicose veins can be treated at home.However, other treatments are available for people who have persistent symptoms or want to improve the cosmetic appearance of the varicose veins. These treatment options include:  Sclerotherapy. A solution is injected into the vein to close it off.  Laser treatment. A laser is used to heat the vein to close it off.  Radiofrequency vein ablation. An electrical current produced by radio waves is used to close off the vein.  Phlebectomy. The vein is surgically  removed through small incisions made over the varicose vein.  Vein ligation and stripping. The vein is surgically removed through incisions made over the varicose vein after the vein has been tied (ligated). Follow these instructions at home:   Do not stand or sit in one position for long periods of time. Do not sit with your legs crossed. Rest with your legs raised during the day.  Wear compression stockings as directed by your health care provider. These stockings help to prevent blood clots and reduce swelling in your legs.  Do not wear other tight, encircling garments around your legs, pelvis, or waist.  Walk as much as possible to increase blood flow.  Raise the foot of your bed at night with 2-inch blocks.  If you get a cut in the skin over the vein and the vein bleeds, lie down with your leg raised and press on it with a clean cloth until the bleeding stops. Then place a bandage (dressing) on the cut. See your health care provider if it continues to bleed. Contact a health care provider if:  The skin around your ankle starts to break down.  You have pain, redness, tenderness, or hard swelling in your leg over a vein.  You are uncomfortable because of leg pain. This information is not intended to replace advice given to you by your health care provider. Make sure you discuss any questions you have with your health care provider. Document Released: 01/07/2005 Document Revised: 09/05/2015 Document Reviewed: 10/01/2015 Elsevier Interactive Patient Education  2017 Elsevier Inc. Sertraline oral solution What is this medicine? SERTRALINE (SER tra leen) is used to treat depression. It may also be used to treat obsessive compulsive disorder, panic disorder, post-trauma stress, premenstrual dysphoric disorder (PMDD) or social anxiety. This medicine may be used for other purposes; ask your health care provider or pharmacist if you have questions. COMMON BRAND NAME(S): Zoloft What should I  tell my health care provider before I take this medicine? They need to know if you have any of these conditions: -bleeding disorders -bipolar disorder or a family history of bipolar disorder -glaucoma -heart disease -high blood pressure -history of irregular heartbeat -history of low levels of calcium, magnesium, or potassium in the blood -if you often drink alcohol -liver disease -receiving electroconvulsive therapy -seizures -suicidal thoughts, plans, or attempt; a previous suicide attempt by you or a family member -take medicines that treat or prevent blood clots -thyroid disease -an unusual or allergic reaction to sertraline, other medicines, foods, dyes, or preservatives -pregnant or trying to get pregnant -breast-feeding How should I use this medicine? Take this medicine by mouth. Follow the directions on the prescription label. Before taking your dose, you need to dilute the solution in a beverage. Measure your medicine dose using the dropper in the bottle. Next, place the measured dose in at least 4 ounces (one-half cup) of water, ginger-ale, lemon-lime soda, lemonade or orange juice and mix. Do not mix in grapefruit juice or in any other liquids other  than those listed. Drink all of mixed liquid immediately. Do not mix the dose and store it for later. It must be taken right away. You may take this medicine with or without food. Take your medicine at regular intervals. Do not take your medicine more often than directed. Do not stop taking this medicine suddenly except upon the advice of your doctor. Stopping this medicine too quickly may cause serious side effects or your condition may worsen. A special MedGuide will be given to you by the pharmacist with each prescription and refill. Be sure to read this information carefully each time. Talk to your pediatrician regarding the use of this medicine in children. While this drug may be prescribed for children as young as 7 years for  selected conditions, precautions do apply. Overdosage: If you think you have taken too much of this medicine contact a poison control center or emergency room at once. NOTE: This medicine is only for you. Do not share this medicine with others. What if I miss a dose? If you miss a dose, take it as soon as you can. If it is almost time for your next dose, take only that dose. Do not take double or extra doses. What may interact with this medicine? Do not take this medicine with any of the following medications: -cisapride -dofetilide -dronedarone -linezolid -MAOIs like Carbex, Eldepryl, Marplan, Nardil, and Parnate -methylene blue (injected into a vein) -pimozide -thioridazine This medicine may also interact with the following medications: -alcohol -amphetamines -aspirin and aspirin-like medicines -certain medicines for depression, anxiety, or psychotic disturbances -certain medicines for fungal infections like ketoconazole, fluconazole, posaconazole, and itraconazole -certain medicines for irregular heart beat like flecainide, quinidine, propafenone -certain medicines for migraine headaches like almotriptan, eletriptan, frovatriptan, naratriptan, rizatriptan, sumatriptan, zolmitriptan -certain medicines for sleep -certain medicines for seizures like carbamazepine, valproic acid, phenytoin -certain medicines that treat or prevent blood clots like warfarin, enoxaparin, dalteparin -cimetidine -digoxin -diuretics -fentanyl -isoniazid -lithium -NSAIDs, medicines for pain and inflammation, like ibuprofen or naproxen -other medicines that prolong the QT interval (cause an abnormal heart rhythm) -rasagiline -safinamide -supplements like St. John's wort, kava kava, valerian -tolbutamide -tramadol -tryptophan This list may not describe all possible interactions. Give your health care provider a list of all the medicines, herbs, non-prescription drugs, or dietary supplements you use.  Also tell them if you smoke, drink alcohol, or use illegal drugs. Some items may interact with your medicine. What should I watch for while using this medicine? Tell your doctor if your symptoms do not get better or if they get worse. Visit your doctor or health care professional for regular checks on your progress. Because it may take several weeks to see the full effects of this medicine, it is important to continue your treatment as prescribed by your doctor. Patients and their families should watch out for new or worsening thoughts of suicide or depression. Also watch out for sudden changes in feelings such as feeling anxious, agitated, panicky, irritable, hostile, aggressive, impulsive, severely restless, overly excited and hyperactive, or not being able to sleep. If this happens, especially at the beginning of treatment or after a change in dose, call your health care professional. Bonita Quin may get drowsy or dizzy. Do not drive, use machinery, or do anything that needs mental alertness until you know how this medicine affects you. Do not stand or sit up quickly, especially if you are an older patient. This reduces the risk of dizzy or fainting spells. Alcohol may interfere with the effect  of this medicine. Avoid alcoholic drinks. This medicine contains a high percentage of alcohol that may interact with medicines used to treat alcohol abuse, like Antabuse (disulfiram). You should not take these medicines together. Your mouth may get dry. Chewing sugarless gum or sucking hard candy, and drinking plenty of water may help. Contact your doctor if the problem does not go away or is severe. Some products may contain alcohol. Ask your pharmacist or healthcare provider if this medicine contains alcohol. Be sure to tell all healthcare providers you are taking this medicine. Certain medicines, like metronidazole and disulfiram, can cause an unpleasant reaction when taken with alcohol. The reaction includes flushing,  headache, nausea, vomiting, sweating, and increased thirst. The reaction can last from 30 minutes to several hours. What side effects may I notice from receiving this medicine? Side effects that you should report to your doctor or health care professional as soon as possible: -allergic reactions like skin rash, itching or hives, swelling of the face, lips, or tongue -anxious -black, tarry stools -changes in vision -confusion -elevated mood, decreased need for sleep, racing thoughts, impulsive behavior -eye pain -fast, irregular heartbeat -feeling faint or lightheaded, falls -feeling agitated, angry, or irritable -hallucination, loss of contact with reality -loss of balance or coordination -loss of memory -painful or prolonged erections -restlessness, pacing, inability to keep still -seizures -stiff muscles -suicidal thoughts or other mood changes -trouble sleeping -unusual bleeding or bruising -unusually weak or tired -vomiting Side effects that usually do not require medical attention (report to your doctor or health care professional if they continue or are bothersome): -change in appetite or weight -change in sex drive or performance -diarrhea -increased sweating -indigestion, nausea -tremors This list may not describe all possible side effects. Call your doctor for medical advice about side effects. You may report side effects to FDA at 1-800-FDA-1088. Where should I keep my medicine? Keep out of the reach of children. Store at room temperature between 15 and 30 degrees C (59 and 86 degrees F). Throw away any unused medicine after the expiration date. NOTE: This sheet is a summary. It may not cover all possible information. If you have questions about this medicine, talk to your doctor, pharmacist, or health care provider.  2018 Elsevier/Gold Standard (2016-04-03 13:50:42) Stretch Marks Stretch marks (striae) are streaks or lines that appear on your skin. Stretch marks  usually appear on your belly, buttocks, breasts, and thighs. Women get stretch marks more often than men. You may be at higher risk for stretch marks if you have a family history of them. Stretch marks are most common in:  Pregnant women.  People who gain or lose weight quickly.  Boys and girls going through puberty.  People who use steroid creams or take steroid drugs.  Bodybuilders who build muscles too quickly. When stretch marks first appear, they may look like flat, wrinkly skin that is stretched out. They may be pink and itchy. Over time, they may become longer and turn red or purple, and old stretch marks lose their color and look white or silvery. Stretch marks are not dangerous for your health. What causes stretch marks? Stretch marks develop when your skin is stretched too quickly. The stretching causes a tear in the middle layer of skin. The tear lets the deeper layer of skin show through. Blood vessels in the deep layer may make stretch marks look pink, red, or purple. This may occur during:  Pregnancy due to:  Weight gain and the increasing size of  your belly.  The hormones produced during pregnancy that allow your tissues to stretch.  Puberty.  Boys may get stretch marks on their back and shoulders.  Girls may get stretch marks on their breasts, hips, and thighs. A less common cause of stretch marks is a disorder called Cushing syndrome. If you have Cushing syndrome, your body produces too much of the hormone cortisol, which can cause stretch marks. Stretch marks from Cushing syndrome tend to be wider and larger than other kinds and may also appear on your face. Tell your health care provider if you have these kinds of stretch marks. Can I make them go away? Stretch marks may fade or disappear over time. There are a few treatment options, but there is not much evidence that any treatment works very well. Treatment may include:  Using skin moisturizers.  Using tretinoin  cream.  This cream is a form of vitamin A. It may improve stretch marks in the early stages.  You will need a prescription from your health care provider and will not be able to use this treatment if you are pregnant, may become pregnant, or are breastfeeding.  Having laser treatment. This may help fade early stretch marks by shrinking the blood vessels in the deep layers of your skin. Is there anything I can do to prevent stretch marks? You may not be able to prevent stretch marks, but you can take steps to reduce your risk, including:  Maintaining a healthy weight and avoiding losing or gaining weight quickly.  Losing no more than one pound a week if you diet, as you may gain the weight back just as quickly.  Eating a healthy and balanced diet with enough vitamins and minerals to keep your skin healthy.  Working with your health care provider to make sure your weight gain is in a healthy range if you are pregnant.  Avoiding bulking up too quickly if you are exercising to build muscle.  Taking steroids and using steroid creams only as directed by your health care provider. This information is not intended to replace advice given to you by your health care provider. Make sure you discuss any questions you have with your health care provider. Document Released: 10/25/2013 Document Revised: 11/27/2015 Document Reviewed: 06/05/2013 Elsevier Interactive Patient Education  2017 [employer].

## 2016-07-27 NOTE — Progress Notes (Signed)
OB WORK IN- pt states she is having some headaches, states her BP was elevated over the weekend, states her legs are swelling

## 2016-07-28 ENCOUNTER — Encounter: Payer: BLUE CROSS/BLUE SHIELD | Admitting: Certified Nurse Midwife

## 2016-08-05 NOTE — Progress Notes (Signed)
Subjective:   Janet Rivera is a 26 y.o. G1P0000 [redacted]w[redacted]d being seen today for problem visit.  Patient reports headaches, elevated blood pressures, and lower extremity swelling.   She has been check her blood pressure several times a day at home since both her mother and mother in law developed preeclampsia with pregnancy.   Denies contractions, vaginal bleeding or leaking of fluid.  Reports good fetal movement.  She also denies difficulty breathing or respiratory distress, chest pain, current headache, blurred vision, epigastric or reflux like pain unrelieved by antiacids, and swelling to her hands or face.   The following portions of the patient's history were reviewed and updated as appropriate: allergies, current medications, past family history, past medical history, past social history, past surgical history and problem list.   Objective:  BP 123/67   Pulse 89   Wt 181 lb 11.2 oz (82.4 kg)   LMP 03/12/2016 (Exact Date)   BMI 32.19 kg/m   Alert and oriented x 4 no apparent distress.  Abdomen is soft, gravid, non-tender, FHT present.  Trace bilateral lower extremity edema. Negative Homans sign. Varicosities present.   Assessment:   Pregnancy:  G1P0000 at [redacted]w[redacted]d  1. Second trimester pregnancy  - POCT urinalysis dipstick  2. Anxiety in pregnancy in second trimester, antepartum   3. 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   Discussed coping techniques for anxiety during pregnancy. Zoloft increased to 100 mg daily, see orders.  Advised compression tights, abdominal support, and adequate water intake for the duration of pregnancy.    Preterm labor symptoms: vaginal bleeding, contractions and leaking of fluid reviewed in detail as well as preeclampsia precautions.   Follow up in 2 weeks as  previously scheduled for anatomy scan. Will do med check at this visit as well.    Gunnar Bulla, CNM   A total of 15 minutes were spent face-to-face with the patient during this encounter and over half of that time dealt with counseling and coordination of care.

## 2016-08-11 ENCOUNTER — Ambulatory Visit (INDEPENDENT_AMBULATORY_CARE_PROVIDER_SITE_OTHER): Payer: BLUE CROSS/BLUE SHIELD | Admitting: Certified Nurse Midwife

## 2016-08-11 ENCOUNTER — Encounter: Payer: Self-pay | Admitting: Certified Nurse Midwife

## 2016-08-11 ENCOUNTER — Ambulatory Visit (INDEPENDENT_AMBULATORY_CARE_PROVIDER_SITE_OTHER): Payer: BLUE CROSS/BLUE SHIELD

## 2016-08-11 VITALS — BP 129/74 | HR 91 | Wt 186.0 lb

## 2016-08-11 DIAGNOSIS — O4442 Low lying placenta NOS or without hemorrhage, second trimester: Secondary | ICD-10-CM

## 2016-08-11 DIAGNOSIS — Z3492 Encounter for supervision of normal pregnancy, unspecified, second trimester: Secondary | ICD-10-CM

## 2016-08-11 DIAGNOSIS — O4440 Low lying placenta NOS or without hemorrhage, unspecified trimester: Secondary | ICD-10-CM

## 2016-08-11 DIAGNOSIS — Z130 Encounter for screening for diseases of the blood and blood-forming organs and certain disorders involving the immune mechanism: Secondary | ICD-10-CM

## 2016-08-11 DIAGNOSIS — Z131 Encounter for screening for diabetes mellitus: Secondary | ICD-10-CM

## 2016-08-11 DIAGNOSIS — R82998 Other abnormal findings in urine: Secondary | ICD-10-CM

## 2016-08-11 DIAGNOSIS — R8299 Other abnormal findings in urine: Secondary | ICD-10-CM

## 2016-08-11 LAB — POCT URINALYSIS DIPSTICK
Bilirubin, UA: NEGATIVE
Glucose, UA: NEGATIVE
KETONES UA: NEGATIVE
Nitrite, UA: NEGATIVE
PH UA: 6 (ref 5.0–8.0)
PROTEIN UA: NEGATIVE
RBC UA: NEGATIVE
SPEC GRAV UA: 1.015 (ref 1.010–1.025)
Urobilinogen, UA: 0.2 E.U./dL

## 2016-08-11 NOTE — Patient Instructions (Signed)
Round Ligament Pain The round ligament is a cord of muscle and tissue that helps to support the uterus. It can become a source of pain during pregnancy if it becomes stretched or twisted as the baby grows. The pain usually begins in the second trimester of pregnancy, and it can come and go until the baby is delivered. It is not a serious problem, and it does not cause harm to the baby. Round ligament pain is usually a short, sharp, and pinching pain, but it can also be a dull, lingering, and aching pain. The pain is felt in the lower side of the abdomen or in the groin. It usually starts deep in the groin and moves up to the outside of the hip area. Pain can occur with:  A sudden change in position.  Rolling over in bed.  Coughing or sneezing.  Physical activity. Follow these instructions at home: Watch your condition for any changes. Take these steps to help with your pain:  When the pain starts, relax. Then try:  Sitting down.  Flexing your knees up to your abdomen.  Lying on your side with one pillow under your abdomen and another pillow between your legs.  Sitting in a warm bath for 15-20 minutes or until the pain goes away.  Take over-the-counter and prescription medicines only as told by your health care provider.  Move slowly when you sit and stand.  Avoid long walks if they cause pain.  Stop or lessen your physical activities if they cause pain. Contact a health care provider if:  Your pain does not go away with treatment.  You feel pain in your back that you did not have before.  Your medicine is not helping. Get help right away if:  You develop a fever or chills.  You develop uterine contractions.  You develop vaginal bleeding.  You develop nausea or vomiting.  You develop diarrhea.  You have pain when you urinate. This information is not intended to replace advice given to you by your health care provider. Make sure you discuss any questions you have with  your health care provider. Document Released: 01/07/2008 Document Revised: 09/05/2015 Document Reviewed: 06/06/2014 Elsevier Interactive Patient Education  2017 Elsevier Inc.  

## 2016-08-11 NOTE — Progress Notes (Signed)
[name]-Pt doing well, presents today with spouse, mother, and mother in law. Reviewed US findings, anatomy WNL and low lying placenta. Follow up US at 28 wks for placenta locations. Pt reports round ligament pain, leg swelling, and foot pain. Homans sign negative. Encouraged use of abdominal support, compression stocking/tights, and shoes that serve an orthopedic function. Advised carbohydrate monitoring and walking after each meal especially dinner. Reviewed red flag symptoms, including previa precautions, and when to call. RTC x 4 weeks for [name].   ULTRASOUND REPORT  Location: [facility] Date of Service: 08/11/16  Indications:Anatomy U/S Findings:  Mason Jim intrauterine pregnancy is visualized with FHR at 152 BPM. Biometrics give an (U/S) Gestational age of 19 0/7 weeks and an (U/S) EDD of 12/29/16; this correlates with the clinically established EDD of 12/29/16. Fetal presentation is Vertex.  EFW:334 g (12 oz) . Placenta: Anterior, Grade 0, Low Lying 2.4 cm from internal os. AFI: Adequate with MVP of 3.8 cm.  Anatomic survey is complete and normal; Gender - female  .   Ovaries are not seen Survey of the adnexa demonstrates no adnexal masses. There is no free peritoneal fluid in the cul de sac.  Impression: 1. 20 week Viable Singleton Intrauterine pregnancy by U/S. 2. (U/S) EDD is consistent with Clinically established (LMP) EDD of 12/29/16. 3. Normal Anatomy Scan 4. Low lying placenta  Recommendations: 1.Clinical correlation with the patient's History and Physical Exam. 2. Return at 28 weeks for placenta location

## 2016-08-13 LAB — URINE CULTURE, OB REFLEX

## 2016-08-13 LAB — CULTURE, OB URINE

## 2016-08-17 ENCOUNTER — Telehealth: Payer: Self-pay | Admitting: Certified Nurse Midwife

## 2016-08-17 NOTE — Telephone Encounter (Signed)
Patient called stating she was in a wedding Saturday and was on her feet all day, now her legs and feet are swollen and she cant walk. Please Advise.

## 2016-08-18 NOTE — Telephone Encounter (Signed)
Will send work note via [facility] today. JML

## 2016-09-09 ENCOUNTER — Other Ambulatory Visit: Payer: Self-pay | Admitting: Certified Nurse Midwife

## 2016-09-09 ENCOUNTER — Ambulatory Visit (INDEPENDENT_AMBULATORY_CARE_PROVIDER_SITE_OTHER): Payer: BLUE CROSS/BLUE SHIELD | Admitting: Certified Nurse Midwife

## 2016-09-09 ENCOUNTER — Encounter: Payer: Self-pay | Admitting: Certified Nurse Midwife

## 2016-09-09 VITALS — BP 116/74 | HR 92 | Wt 191.0 lb

## 2016-09-09 DIAGNOSIS — Z3492 Encounter for supervision of normal pregnancy, unspecified, second trimester: Secondary | ICD-10-CM

## 2016-09-09 DIAGNOSIS — Z3402 Encounter for supervision of normal first pregnancy, second trimester: Secondary | ICD-10-CM

## 2016-09-09 DIAGNOSIS — O1200 Gestational edema, unspecified trimester: Secondary | ICD-10-CM

## 2016-09-09 LAB — POCT URINALYSIS DIPSTICK
Bilirubin, UA: NEGATIVE
Blood, UA: NEGATIVE
GLUCOSE UA: NEGATIVE
KETONES UA: NEGATIVE
Leukocytes, UA: NEGATIVE
Nitrite, UA: NEGATIVE
PROTEIN UA: NEGATIVE
SPEC GRAV UA: 1.02 (ref 1.010–1.025)
Urobilinogen, UA: 0.2 E.U./dL
pH, UA: 7.5 (ref 5.0–8.0)

## 2016-09-09 MED ORDER — SERTRALINE HCL 50 MG PO TABS: 150.0000 mg | ORAL_TABLET | Freq: Every day | ORAL | 0 refills | Status: DC

## 2016-09-09 NOTE — Patient Instructions (Signed)

## 2016-09-09 NOTE — Progress Notes (Signed)
[name], complains of increasing anxiety that is affecting her daily life. Discussed options and she is requesting to increase zoloft. Reviewed risk and benefits and she agrees to plan. She also complains of occasional headaches, swelling , and "bumps" all over.  On exam bumps indicative of heat rash. Small red  papules on the top layer of skin that is itching.  Suggested avoidance of tight fitting clothes that can irritate skin. When it's hot, stay in the shade or in an air-conditioned building or use a fan to circulate the air. Keep your sleeping area cool and well-ventilated. Swelling noted in 1-2 + bilaterally, reflexes 2+, no clonus. No visual disturbances , and no epigastric pain. She does state that she has occasional headaches that sometimes improve with tylenol. Encouraged increased water. Will do baseline labs today.  Reviewed PIH signs and symptoms. Follow up in 4 wks. With u/s, [name], and GTT.  Janet BurkeAnnie Timothy Rivera, CNM

## 2016-09-10 LAB — COMPREHENSIVE METABOLIC PANEL
A/G RATIO: 1.3 (ref 1.2–2.2)
ALK PHOS: 98 IU/L (ref 39–117)
ALT: 14 IU/L (ref 0–32)
AST: 16 IU/L (ref 0–40)
Albumin: 3.5 g/dL (ref 3.5–5.5)
BUN/Creatinine Ratio: 14 (ref 9–23)
BUN: 8 mg/dL (ref 6–20)
CHLORIDE: 104 mmol/L (ref 96–106)
CO2: 22 mmol/L (ref 18–29)
Calcium: 9.5 mg/dL (ref 8.7–10.2)
Creatinine, Ser: 0.58 mg/dL (ref 0.57–1.00)
GFR calc Af Amer: 148 mL/min/{1.73_m2} (ref >59)
GFR calc non Af Amer: 129 mL/min/{1.73_m2} (ref >59)
GLUCOSE: 84 mg/dL (ref 65–99)
Globulin, Total: 2.6 g/dL (ref 1.5–4.5)
POTASSIUM: 4.4 mmol/L (ref 3.5–5.2)
Sodium: 138 mmol/L (ref 134–144)
Total Protein: 6.1 g/dL (ref 6.0–8.5)

## 2016-09-10 LAB — CBC WITH DIFFERENTIAL/PLATELET
BASOS ABS: 0 10*3/uL (ref 0.0–0.2)
Basos: 0 %
EOS (ABSOLUTE): 0.1 10*3/uL (ref 0.0–0.4)
Eos: 2 %
Hematocrit: 35.6 % (ref 34.0–46.6)
Hemoglobin: 11.9 g/dL (ref 11.1–15.9)
Immature Grans (Abs): 0.1 10*3/uL (ref 0.0–0.1)
Immature Granulocytes: 1 %
LYMPHS ABS: 2 10*3/uL (ref 0.7–3.1)
Lymphs: 22 %
MCH: 30.7 pg (ref 26.6–33.0)
MCHC: 33.4 g/dL (ref 31.5–35.7)
MCV: 92 fL (ref 79–97)
MONOS ABS: 0.6 10*3/uL (ref 0.1–0.9)
Monocytes: 7 %
NEUTROS ABS: 6.3 10*3/uL (ref 1.4–7.0)
Neutrophils: 68 %
PLATELETS: 315 10*3/uL (ref 150–379)
RBC: 3.88 x10E6/uL (ref 3.77–5.28)
RDW: 13.5 % (ref 12.3–15.4)
WBC: 9.2 10*3/uL (ref 3.4–10.8)

## 2016-09-10 LAB — PROTEIN / CREATININE RATIO, URINE
CREATININE, UR: 126.8 mg/dL
PROTEIN/CREAT RATIO: 122 mg/g{creat} (ref 0–200)
Protein, Ur: 15.5 mg/dL

## 2016-09-10 LAB — URIC ACID: Uric Acid: 4.1 mg/dL (ref 2.5–7.1)

## 2016-09-11 ENCOUNTER — Encounter: Payer: Self-pay | Admitting: Certified Nurse Midwife

## 2016-09-15 ENCOUNTER — Observation Stay
Admission: EM | Admit: 2016-09-15 | Discharge: 2016-09-15 | Disposition: A | Discharge status: Home / Self Care | Payer: BLUE CROSS/BLUE SHIELD | Attending: Obstetrics and Gynecology | Admitting: Obstetrics and Gynecology

## 2016-09-15 ENCOUNTER — Observation Stay: Payer: BLUE CROSS/BLUE SHIELD

## 2016-09-15 DIAGNOSIS — O26892 Other specified pregnancy related conditions, second trimester: Principal | ICD-10-CM | POA: Insufficient documentation

## 2016-09-15 DIAGNOSIS — R1011 Right upper quadrant pain: Secondary | ICD-10-CM | POA: Insufficient documentation

## 2016-09-15 DIAGNOSIS — Z3A25 25 weeks gestation of pregnancy: Secondary | ICD-10-CM | POA: Diagnosis not present

## 2016-09-15 DIAGNOSIS — R1110 Vomiting, unspecified: Secondary | ICD-10-CM | POA: Diagnosis not present

## 2016-09-15 DIAGNOSIS — R51 Headache: Secondary | ICD-10-CM | POA: Insufficient documentation

## 2016-09-15 LAB — COMPREHENSIVE METABOLIC PANEL
ALT: 16 U/L (ref 14–54)
AST: 23 U/L (ref 15–41)
Albumin: 2.8 g/dL — ABNORMAL LOW (ref 3.5–5.0)
Alkaline Phosphatase: 88 U/L (ref 38–126)
Anion gap: 7 (ref 5–15)
BILIRUBIN TOTAL: 0.5 mg/dL (ref 0.3–1.2)
BUN: 7 mg/dL (ref 6–20)
CO2: 19 mmol/L — ABNORMAL LOW (ref 22–32)
CREATININE: 0.31 mg/dL — AB (ref 0.44–1.00)
Calcium: 8.7 mg/dL — ABNORMAL LOW (ref 8.9–10.3)
Chloride: 112 mmol/L — ABNORMAL HIGH (ref 101–111)
Glucose, Bld: 87 mg/dL (ref 65–99)
POTASSIUM: 3.6 mmol/L (ref 3.5–5.1)
Sodium: 138 mmol/L (ref 135–145)
TOTAL PROTEIN: 6.3 g/dL — AB (ref 6.5–8.1)

## 2016-09-15 LAB — CBC
HEMATOCRIT: 33.4 % — AB (ref 35.0–47.0)
Hemoglobin: 11.7 g/dL — ABNORMAL LOW (ref 12.0–16.0)
MCH: 31.1 pg (ref 26.0–34.0)
MCHC: 35.1 g/dL (ref 32.0–36.0)
MCV: 88.6 fL (ref 80.0–100.0)
Platelets: 281 10*3/uL (ref 150–440)
RBC: 3.77 MIL/uL — ABNORMAL LOW (ref 3.80–5.20)
RDW: 13.5 % (ref 11.5–14.5)
WBC: 10.1 10*3/uL (ref 3.6–11.0)

## 2016-09-15 LAB — LIPASE, BLOOD: LIPASE: 26 U/L (ref 11–51)

## 2016-09-15 LAB — AMYLASE: AMYLASE: 61 U/L (ref 28–100)

## 2016-09-15 MED ORDER — ACETAMINOPHEN 325 MG PO TABS: 650.0000 mg | ORAL_TABLET | ORAL | Status: DC | PRN

## 2016-09-15 MED ORDER — SODIUM CHLORIDE FLUSH 0.9 % IV SOLN
INTRAVENOUS | Status: AC
  Filled 2016-09-15: qty 10

## 2016-09-15 NOTE — Progress Notes (Signed)
   L&D OB Triage Note  SUBJECTIVE Janet Rivera is a 26 y.o. G1P0000 female at 1035w0d, EDD Estimated Date of Delivery: 12/29/16 who presented to triage with complaints of RUQ pain around 1000 this morning. She denies that anything makes it worse or better. She denies LOF, vaginal bleeding and contractions. Endorses good fetal movement.    Obstetric History   G1   P0   T0   P0   A0   L0    SAB0   TAB0   Ectopic0   Multiple0   Live Births0     # Outcome Date GA Lbr Len/2nd Weight Sex Delivery Anes PTL Lv  1 Current               No prescriptions prior to admission.     OBJECTIVE  Nursing Evaluation:   BP 122/70 (BP Location: Left Arm)   Pulse 88   Temp 98.3 F (36.8 C) (Oral)   Resp 16   Ht 5\' 3"  (1.6 m)   Wt 191 lb (86.6 kg)   LMP 03/12/2016 (Exact Date)   SpO2 100%   BMI 33.83 kg/m    NST was performed and has been reviewed by me.  NST INTERPRETATION: Category I  Mode: (P) External Baseline Rate (A): 150 bpm Variability: Moderate Accelerations: 15 x 15 Decelerations: Variable     Contraction Frequency (min): none  Amylase 61 Lipase  26 H&H : 11.7 , 33.4 Plt: 281 AST 23 ALT 16  Ultrasound: No gallstones or sonographic evidence of acute cholecystitis. Normal appearance of the liver and common bile duct.  ASSESSMENT Impression:  1. Pregnancy:  G1P0000 at 435w0d , EDD Estimated Date of Delivery: 12/29/16 2.  Reactive NST  PLAN 1. Reassurance given 2. Discharge home with precautions to return to L&D or call the office if:  increased leakage or fluid or contractions more than  6 per  1 hour  3. Continue routine prenatal care as scheduled.   Doreene BurkeAnnie Josiah Nieto, CNM

## 2016-09-15 NOTE — OB Triage Note (Signed)
Pt presents via EMS from work where she started having right upper abdominal pain described as a stabbing pain. Rates pain 6/10. Pt reports pain starting around 10:00 this morning. Pt also reports a headache as well, which she was given 1000 mg of tylenol via EMS. Pt has vomited today. Denies any LOF, or bleeding. Reports positive fetal movement. Pt states she has had plenty to eat and drink today. No blurry vision, no clonus, +2 reflexes, lungs are clear.

## 2016-09-15 NOTE — Discharge Instructions (Signed)

## 2016-10-06 ENCOUNTER — Ambulatory Visit: Payer: BLUE CROSS/BLUE SHIELD

## 2016-10-06 ENCOUNTER — Encounter: Payer: BLUE CROSS/BLUE SHIELD | Admitting: Certified Nurse Midwife

## 2016-10-06 ENCOUNTER — Other Ambulatory Visit: Payer: Self-pay | Admitting: Certified Nurse Midwife

## 2016-10-06 ENCOUNTER — Other Ambulatory Visit: Payer: BLUE CROSS/BLUE SHIELD

## 2016-10-06 ENCOUNTER — Ambulatory Visit (INDEPENDENT_AMBULATORY_CARE_PROVIDER_SITE_OTHER): Payer: BLUE CROSS/BLUE SHIELD | Admitting: Certified Nurse Midwife

## 2016-10-06 VITALS — BP 114/73 | HR 87 | Wt 198.9 lb

## 2016-10-06 DIAGNOSIS — Z3492 Encounter for supervision of normal pregnancy, unspecified, second trimester: Secondary | ICD-10-CM

## 2016-10-06 DIAGNOSIS — Z131 Encounter for screening for diabetes mellitus: Secondary | ICD-10-CM

## 2016-10-06 DIAGNOSIS — Z23 Encounter for immunization: Secondary | ICD-10-CM

## 2016-10-06 DIAGNOSIS — F419 Anxiety disorder, unspecified: Secondary | ICD-10-CM

## 2016-10-06 DIAGNOSIS — O4440 Low lying placenta NOS or without hemorrhage, unspecified trimester: Secondary | ICD-10-CM

## 2016-10-06 DIAGNOSIS — R8299 Other abnormal findings in urine: Secondary | ICD-10-CM

## 2016-10-06 DIAGNOSIS — Z3403 Encounter for supervision of normal first pregnancy, third trimester: Secondary | ICD-10-CM

## 2016-10-06 DIAGNOSIS — O4442 Low lying placenta NOS or without hemorrhage, second trimester: Secondary | ICD-10-CM

## 2016-10-06 DIAGNOSIS — R82998 Other abnormal findings in urine: Secondary | ICD-10-CM

## 2016-10-06 DIAGNOSIS — O99340 Other mental disorders complicating pregnancy, unspecified trimester: Secondary | ICD-10-CM

## 2016-10-06 DIAGNOSIS — Z130 Encounter for screening for diseases of the blood and blood-forming organs and certain disorders involving the immune mechanism: Secondary | ICD-10-CM

## 2016-10-06 LAB — POCT URINALYSIS DIPSTICK
Bilirubin, UA: NEGATIVE
Blood, UA: NEGATIVE
GLUCOSE UA: NEGATIVE
Ketones, UA: NEGATIVE
NITRITE UA: NEGATIVE
PROTEIN UA: NEGATIVE
SPEC GRAV UA: 1.015 (ref 1.010–1.025)
UROBILINOGEN UA: 0.2 U/dL
pH, UA: 6 (ref 5.0–8.0)

## 2016-10-06 MED ORDER — VENLAFAXINE HCL ER 75 MG PO CP24: 75.0000 mg | ORAL_CAPSULE | Freq: Every day | ORAL | 0 refills | Status: DC

## 2016-10-06 MED ORDER — NYSTATIN-TRIAMCINOLONE 100000-0.1 UNIT/GM-% EX CREA: 1.0000 "application " | TOPICAL_CREAM | Freq: Two times a day (BID) | CUTANEOUS | 0 refills | Status: DC

## 2016-10-06 NOTE — Progress Notes (Signed)
[name]-Pt reports bilateral carpal tunnel syndrome, lower extremity swelling, nausea, and increased anxiety. Discussed home treatment measures for carpal tunnel management and referral to neurology, pt declined neurology referral at time time. Generalized lower extremity edema present, non-pitting. Pt is not wearing abdominal support or compression stockings at advised, she is wearing "compression leggings".  Will send options for pregnancy compression stockings via MyChart. Pt stopped taking Zoloft last week, because it "was not working". Would like to restart Effexor XR. Risks and benefits reviewed, pt verbalized understanding. Rx Effexor XR 75 mg, see orders. Referral to therapy, see orders. [facility]. Reviewed red flag symptoms and when to call. RTC x 1 week for medication check. RTC x 2 weeks [name] with Melody.   ULTRASOUND REPORT  Location: [facility] Date of Service: 10/06/16  Indications:LLP check Findings:  Mason JimSingleton intrauterine pregnancy is visualized with FHR at 139 BPM.  Fetal presentation is Vertex.  Placenta: Anterior, grade 0, 7.9 cm from internal os. AFI: 11.1 cm.  Fetal stomach and bladder are seen.  Impression: 1. Placenta is 7.9 cm from internal os  Recommendations: 1.Clinical correlation with the patient's History and Physical Exam.

## 2016-10-06 NOTE — Patient Instructions (Addendum)
Doren Custard Class  September 23, 2016  Wednesday 7:00p - 9:00p  [place], [place]  October 28, 2016  Wednesday [place], [place]    December 02, 2016   Wednesday [place], [place]  December 30, 2016  Wednesday  [place], [place]  January 27, 2017 Wednesday [place], [place]  Interested in a waterbirth?  This informational class will help you discover whether waterbirth is the right fit for you.  Education about waterbirth itself, supplies you would need and how to assemble your support team is what you can expect from this class.  Some obstetrical practices require this class in order to pursue a waterbirth.  (Not all obstetrical practices offer waterbirth check with your healthcare provider)  Register only the expectant mom, but you are encouraged to bring your partner to class!  Fees & Payment No fee  Register Online [email or web address] Search [place] 2018 Prenatal Education Class Schedule Register at [email or web address] in the [place] or call Kerman Passey at [phone] 9:00a-5:00p M-F  Childbirth Preparation Certified Childbirth Educators teach this 5 week course.  Expectant parents are encouraged to take this class in their 3rd trimester, completing it by their 35-36th week. Meets in [facility], Lower Level.  Mondays Thursdays  7:00-9:00 p 7:00-9:00 p  July 23 - August 20 July 19 - August 16  September 17 - October 15 September 6 -October 4  November 5 - December 3 October 25 - November 29   No Class on Thanksgiving Day -November 22  Childbirth Preparation Refresher Course For those who have previously attended Prepared Childbirth Preparation classes, this class in incorporated into the 3rd and 4th  classes in the Monday night childbirth series.  Course meets in the [facility]. Lower Level from 7:00p - 9:00p  August 6 & 13  October 1 & 8  November 19 & 26   Weekend Childbirth Waymon Amato Classes are held Saturday & Sunday, 1:00 5:00p Course meets in [facility] [place] Level  August 4 & 5  November 3 & 4    The BirthPlace Tours Free tours are held on the third Sunday of each month at 3 pm.  The tour meets in the third floor waiting area and will take approximately 30 minutes.  Tours are also included in Childbirth class series as well as Brother/Sister class.  An online virtual tour can be seen at [email or web address].         Breastfeeding & Infant Nutrition The course incorporates returning to work or school.  Breast milk collection and storage with basic breastfeeding and infant nutrition. This two-class course is held the 2nd and 3rd Tuesday of each month from 7:00 -9:00 pm.  Course meets in the [place] [facility] Lower level  June 12 & 19 July-No Class  August 14 & 21 September 11 &18  September 11 & 18 October 9 &16  November 13 & 20 December 11 & 18   [employer] welcomes any mother for a social outing with other Moms to share experiences and challenges in an informal setting.  Meets the 1st Thursday and 3rd Thursday 11:30a-1:00 pm of each month in [facility] 3rd floor classroom.  No registration required.  Newborn Essentials This course covers bathing, diapering, swaddling and more with practice on lifelike dolls.  Participants  will also learn safety tips and infant CPR (Not for certification).  It is held the 1st Wednesday of each month from 7:00p-9:00p in the [facility] Education Center, Lower level.  June 6 July- No Class  August 1 September 5  October 3 November 7  December 7    Preparing Big Brother & Sister This one session course prepares children and their parents for the arrival of a new baby.  It is held on the 1st Tuesday of  each month from 6:30p - 8:00p. Course meets in the [facility] Education Center, Lower level.  July-No Class August 7  September 4 October 2  November 6 December 4   [place] [facility] [employer] Dads This nationally acclaimed class helps expecting and new dads with the basic skills and confidence to bond with their infants, support their mates, and provide a safe and healthy home environment for their new family. Classes are held the 2nd Saturday of every month from 9:00a - 12:00 noon.  Course meets in the [facility] Education Center Lower level.  June 9 August 11  October 13 No Class in December  Tdap Vaccine (Tetanus, Diphtheria and Pertussis): What You Need to Know 1. Why get vaccinated? Tetanus, diphtheria and pertussis are very serious diseases. Tdap vaccine can protect us from these diseases. And, Tdap vaccine given to pregnant women can protect newborn babies against pertussis. TETANUS (Lockjaw) is rare in the [place] today. It causes painful muscle tightening and stiffness, usually all over the body.  It can lead to tightening of muscles in the head and neck so you can't open your mouth, swallow, or sometimes even breathe. Tetanus kills about 1 out of 10 people who are infected even after receiving the best medical care.  DIPHTHERIA is also rare in the [place] today. It can cause a thick coating to form in the back of the throat.  It can lead to breathing problems, heart failure, paralysis, and death.  PERTUSSIS (Whooping Cough) causes severe coughing spells, which can cause difficulty breathing, vomiting and disturbed sleep.  It can also lead to weight loss, incontinence, and rib fractures. Up to 2 in 100 adolescents and 5 in 100 adults with pertussis are hospitalized or have complications, which could include pneumonia or death.  These diseases are caused by bacteria. Diphtheria and pertussis are spread from person to person through secretions from coughing or sneezing. Tetanus enters the  body through cuts, scratches, or wounds. Before vaccines, as many as 200,000 cases of diphtheria, 200,000 cases of pertussis, and hundreds of cases of tetanus, were reported in the [place] each year. Since vaccination began, reports of cases for tetanus and diphtheria have dropped by about 99% and for pertussis by about 80%. 2. Tdap vaccine Tdap vaccine can protect adolescents and adults from tetanus, diphtheria, and pertussis. One dose of Tdap is routinely given at age 511 or 6912. People who did not get Tdap at that age should get it as soon as possible. Tdap is especially important for healthcare professionals and anyone having close contact with a baby younger than 12 months. Pregnant women should get a dose of Tdap during every pregnancy, to protect the newborn from pertussis. Infants are most at risk for severe, life-threatening complications from pertussis. Another vaccine, called Td, protects against tetanus and diphtheria, but not pertussis. A Td booster should be given every 10 years. Tdap may be given as one of these boosters if you have never gotten Tdap before. Tdap may also be given  after a severe cut or burn to prevent tetanus infection. Your doctor or the person giving you the vaccine can give you more information. Tdap may safely be given at the same time as other vaccines. 3. Some people should not get this vaccine  A person who has ever had a life-threatening allergic reaction after a previous dose of any diphtheria, tetanus or pertussis containing vaccine, OR has a severe allergy to any part of this vaccine, should not get Tdap vaccine. Tell the person giving the vaccine about any severe allergies.  Anyone who had coma or long repeated seizures within 7 days after a childhood dose of DTP or DTaP, or a previous dose of Tdap, should not get Tdap, unless a cause other than the vaccine was found. They can still get Td.  Talk to your doctor if you: ? have seizures or another nervous  system problem, ? had severe pain or swelling after any vaccine containing diphtheria, tetanus or pertussis, ? ever had a condition called Guillain-Barr Syndrome (GBS), ? aren't feeling well on the day the shot is scheduled. 4. Risks With any medicine, including vaccines, there is a chance of side effects. These are usually mild and go away on their own. Serious reactions are also possible but are rare. Most people who get Tdap vaccine do not have any problems with it. Mild problems following Tdap: (Did not interfere with activities)  Pain where the shot was given (about 3 in 4 adolescents or 2 in 3 adults)  Redness or swelling where the shot was given (about 1 person in 5)  Mild fever of at least 100.72F (up to about 1 in 25 adolescents or 1 in 100 adults)  Headache (about 3 or 4 people in 10)  Tiredness (about 1 person in 3 or 4)  Nausea, vomiting, diarrhea, stomach ache (up to 1 in 4 adolescents or 1 in 10 adults)  Chills, sore joints (about 1 person in 10)  Body aches (about 1 person in 3 or 4)  Rash, swollen glands (uncommon)  Moderate problems following Tdap: (Interfered with activities, but did not require medical attention)  Pain where the shot was given (up to 1 in 5 or 6)  Redness or swelling where the shot was given (up to about 1 in 16 adolescents or 1 in 12 adults)  Fever over 102F (about 1 in 100 adolescents or 1 in 250 adults)  Headache (about 1 in 7 adolescents or 1 in 10 adults)  Nausea, vomiting, diarrhea, stomach ache (up to 1 or 3 people in 100)  Swelling of the entire arm where the shot was given (up to about 1 in 500).  Severe problems following Tdap: (Unable to perform usual activities; required medical attention)  Swelling, severe pain, bleeding and redness in the arm where the shot was given (rare).  Problems that could happen after any vaccine:  People sometimes faint after a medical procedure, including vaccination. Sitting or lying down  for about 15 minutes can help prevent fainting, and injuries caused by a fall. Tell your doctor if you feel dizzy, or have vision changes or ringing in the ears.  Some people get severe pain in the shoulder and have difficulty moving the arm where a shot was given. This happens very rarely.  Any medication can cause a severe allergic reaction. Such reactions from a vaccine are very rare, estimated at fewer than 1 in a million doses, and would happen within a few minutes to a few hours after  the vaccination. As with any medicine, there is a very remote chance of a vaccine causing a serious injury or death. The safety of vaccines is always being monitored. For more information, visit: http://floyd.org/ 5. What if there is a serious problem? What should I look for? Look for anything that concerns you, such as signs of a severe allergic reaction, very high fever, or unusual behavior. Signs of a severe allergic reaction can include hives, swelling of the face and throat, difficulty breathing, a fast heartbeat, dizziness, and weakness. These would usually start a few minutes to a few hours after the vaccination. What should I do?  If you think it is a severe allergic reaction or other emergency that can't wait, call 9-1-1 or get the person to the nearest hospital. Otherwise, call your doctor.  Afterward, the reaction should be reported to the Vaccine Adverse Event Reporting System (VAERS). Your doctor might file this report, or you can do it yourself through the VAERS web site at www.vaers.LAgents.no, or by calling 1-228-319-6651. ? VAERS does not give medical advice. 6. The National Vaccine Injury Compensation Program The [employer] Vaccine Injury Compensation Program (VICP) is a federal program that was created to compensate people who may have been injured by certain vaccines. Persons who believe they may have been injured by a vaccine can learn about the program and about filing a claim by  calling 1-351-475-6674 or visiting the VICP website at SpiritualWord.at. There is a time limit to file a claim for compensation. 7. How can I learn more?  Ask your doctor. He or she can give you the vaccine package insert or suggest other sources of information.  Call your local or state health department.  Contact the Centers for Disease Control and Prevention (CDC): ? Call 708-284-4124 (1-800-CDC-INFO) or ? Visit CDC's website at PicCapture.uy CDC Tdap Vaccine VIS (06/06/13) This information is not intended to replace advice given to you by your health care provider. Make sure you discuss any questions you have with your health care provider. Document Released: 09/29/2011 Document Revised: 12/19/2015 Document Reviewed: 12/19/2015 Elsevier Interactive Patient Education  2017 [employer].

## 2016-10-07 LAB — GLUCOSE, 1 HOUR GESTATIONAL: GESTATIONAL DIABETES SCREEN: 138 mg/dL (ref 65–139)

## 2016-10-07 LAB — HEMOGLOBIN AND HEMATOCRIT, BLOOD
HEMATOCRIT: 31.6 % — AB (ref 34.0–46.6)
HEMOGLOBIN: 10.5 g/dL — AB (ref 11.1–15.9)

## 2016-10-08 LAB — URINE CULTURE, OB REFLEX

## 2016-10-08 LAB — CULTURE, OB URINE

## 2016-10-13 ENCOUNTER — Encounter: Payer: BLUE CROSS/BLUE SHIELD | Admitting: Certified Nurse Midwife

## 2016-10-16 ENCOUNTER — Encounter: Payer: Self-pay | Admitting: Certified Nurse Midwife

## 2016-10-20 ENCOUNTER — Ambulatory Visit (INDEPENDENT_AMBULATORY_CARE_PROVIDER_SITE_OTHER): Payer: BLUE CROSS/BLUE SHIELD | Admitting: Obstetrics and Gynecology

## 2016-10-20 VITALS — BP 125/77 | HR 86 | Wt 201.0 lb

## 2016-10-20 DIAGNOSIS — Z3493 Encounter for supervision of normal pregnancy, unspecified, third trimester: Secondary | ICD-10-CM

## 2016-10-20 DIAGNOSIS — O26893 Other specified pregnancy related conditions, third trimester: Secondary | ICD-10-CM

## 2016-10-20 DIAGNOSIS — R12 Heartburn: Secondary | ICD-10-CM

## 2016-10-20 LAB — POCT URINALYSIS DIPSTICK
BILIRUBIN UA: NEGATIVE
Glucose, UA: NEGATIVE
KETONES UA: NEGATIVE
LEUKOCYTES UA: NEGATIVE
NITRITE UA: NEGATIVE
PH UA: 6.5 (ref 5.0–8.0)
PROTEIN UA: NEGATIVE
Spec Grav, UA: 1.01 (ref 1.010–1.025)
Urobilinogen, UA: 0.2 E.U./dL

## 2016-10-20 MED ORDER — NYSTATIN-TRIAMCINOLONE 100000-0.1 UNIT/GM-% EX CREA: 1.0000 "application " | TOPICAL_CREAM | Freq: Two times a day (BID) | CUTANEOUS | 0 refills | Status: DC

## 2016-10-20 MED ORDER — RANITIDINE HCL 150 MG PO TABS: 150.0000 mg | ORAL_TABLET | Freq: Two times a day (BID) | ORAL | 4 refills | Status: DC

## 2016-10-20 NOTE — Progress Notes (Signed)
[name]- pt states medication is working well for her, she is having some heart palpations,

## 2016-10-20 NOTE — Progress Notes (Signed)
[name]- states feeling 30-40% better on Effexor. Enrolled in classes already.

## 2016-10-20 NOTE — Patient Instructions (Signed)
Medroxyprogesterone injection [Contraceptive] What is this medicine? MEDROXYPROGESTERONE (me DROX ee proe JES te rone) contraceptive injections prevent pregnancy. They provide effective birth control for 3 months. Depo-subQ Provera 104 is also used for treating pain related to endometriosis. This medicine may be used for other purposes; ask your health care provider or pharmacist if you have questions. COMMON BRAND NAME(S): Depo-Provera, Depo-subQ Provera 104 What should I tell my health care provider before I take this medicine? They need to know if you have any of these conditions: -frequently drink alcohol -asthma -blood vessel disease or a history of a blood clot in the lungs or legs -bone disease such as osteoporosis -breast cancer -diabetes -eating disorder (anorexia nervosa or bulimia) -high blood pressure -HIV infection or AIDS -kidney disease -liver disease -mental depression -migraine -seizures (convulsions) -stroke -tobacco smoker -vaginal bleeding -an unusual or allergic reaction to medroxyprogesterone, other hormones, medicines, foods, dyes, or preservatives -pregnant or trying to get pregnant -breast-feeding How should I use this medicine? Depo-Provera Contraceptive injection is given into a muscle. Depo-subQ Provera 104 injection is given under the skin. These injections are given by a health care professional. You must not be pregnant before getting an injection. The injection is usually given during the first 5 days after the start of a menstrual period or 6 weeks after delivery of a baby. Talk to your pediatrician regarding the use of this medicine in children. Special care may be needed. These injections have been used in female children who have started having menstrual periods. Overdosage: If you think you have taken too much of this medicine contact a poison control center or emergency room at once. NOTE: This medicine is only for you. Do not share this medicine  with others. What if I miss a dose? Try not to miss a dose. You must get an injection once every 3 months to maintain birth control. If you cannot keep an appointment, call and reschedule it. If you wait longer than 13 weeks between Depo-Provera contraceptive injections or longer than 14 weeks between Depo-subQ Provera 104 injections, you could get pregnant. Use another method for birth control if you miss your appointment. You may also need a pregnancy test before receiving another injection. What may interact with this medicine? Do not take this medicine with any of the following medications: -bosentan This medicine may also interact with the following medications: -aminoglutethimide -antibiotics or medicines for infections, especially rifampin, rifabutin, rifapentine, and griseofulvin -aprepitant -barbiturate medicines such as phenobarbital or primidone -bexarotene -carbamazepine -medicines for seizures like ethotoin, felbamate, oxcarbazepine, phenytoin, topiramate -modafinil -St. John's wort This list may not describe all possible interactions. Give your health care provider a list of all the medicines, herbs, non-prescription drugs, or dietary supplements you use. Also tell them if you smoke, drink alcohol, or use illegal drugs. Some items may interact with your medicine. What should I watch for while using this medicine? This drug does not protect you against HIV infection (AIDS) or other sexually transmitted diseases. Use of this product may cause you to lose calcium from your bones. Loss of calcium may cause weak bones (osteoporosis). Only use this product for more than 2 years if other forms of birth control are not right for you. The longer you use this product for birth control the more likely you will be at risk for weak bones. Ask your health care professional how you can keep strong bones. You may have a change in bleeding pattern or irregular periods. Many females stop having    periods while taking this drug. If you have received your injections on time, your chance of being pregnant is very low. If you think you may be pregnant, see your health care professional as soon as possible. Tell your health care professional if you want to get pregnant within the next year. The effect of this medicine may last a long time after you get your last injection. What side effects may I notice from receiving this medicine? Side effects that you should report to your doctor or health care professional as soon as possible: -allergic reactions like skin rash, itching or hives, swelling of the face, lips, or tongue -breast tenderness or discharge -breathing problems -changes in vision -depression -feeling faint or lightheaded, falls -fever -pain in the abdomen, chest, groin, or leg -problems with balance, talking, walking -unusually weak or tired -yellowing of the eyes or skin Side effects that usually do not require medical attention (report to your doctor or health care professional if they continue or are bothersome): -acne -fluid retention and swelling -headache -irregular periods, spotting, or absent periods -temporary pain, itching, or skin reaction at site where injected -weight gain This list may not describe all possible side effects. Call your doctor for medical advice about side effects. You may report side effects to FDA at 1-800-FDA-1088. Where should I keep my medicine? This does not apply. The injection will be given to you by a health care professional. NOTE: This sheet is a summary. It may not cover all possible information. If you have questions about this medicine, talk to your doctor, pharmacist, or health care provider.  2018 Elsevier/Gold Standard (2008-04-20 18:37:56) Heartburn During Pregnancy Heartburn is a type of pain or discomfort that can happen in the throat or chest. It is often described as a burning sensation. Heartburn is common during  pregnancy because:  A hormone (progesterone) that is released during pregnancy may relax the valve (lower esophageal sphincter, or LES) that separates the esophagus from the stomach. This allows stomach acid to move up into the esophagus, causing heartburn.  The uterus gets larger and pushes up on the stomach, which pushes more acid into the esophagus. This is especially true in the later stages of pregnancy.  Heartburn usually goes away or gets better after giving birth. What are the causes? Heartburn is caused by stomach acid backing up into the esophagus (reflux). Reflux can be triggered by:  Changing hormone levels.  Large meals.  Certain foods and beverages, such as coffee, chocolate, onions, and peppermint.  Exercise.  Increased stomach acid production.  What increases the risk? You are more likely to experience heartburn during pregnancy if you:  Had heartburn prior to becoming pregnant.  Have been pregnant more than once before.  Are overweight or obese.  The likelihood that you will get heartburn also increases as you get farther along in your pregnancy, especially during the last trimester. What are the signs or symptoms? Symptoms of this condition include:  Burning pain in the chest or lower throat.  Bitter taste in the mouth.  Coughing.  Problems swallowing.  Vomiting.  Hoarse voice.  Asthma.  Symptoms may get worse when you lie down or bend over. Symptoms are often worse at night. How is this diagnosed? This condition is diagnosed based on:  Your medical history.  Your symptoms.  Blood tests to check for a certain type of bacteria associated with heartburn.  Whether taking heartburn medicine relieves your symptoms.  Examination of the stomach and esophagus using a  tube with a light and camera on the end (endoscopy).  How is this treated? Treatment varies depending on how severe your symptoms are. Your health care provider may  recommend:  Over-the-counter medicines (antacids or acid reducers) for mild heartburn.  Prescription medicines to decrease stomach acid or to protect your stomach lining.  Certain changes in your diet.  Raising the head of your bed so it is higher than the foot of the bed. This helps prevent stomach acid from backing up into the esophagus when you are lying down.  Follow these instructions at home: Eating and drinking  Do not drink alcohol during your pregnancy.  Identify foods and beverages that make your symptoms worse, and avoid them.  Beverages that you may want to avoid include: ? Coffee and tea (with or without caffeine). ? Energy drinks and sports drinks. ? Carbonated drinks or sodas. ? Citrus fruit juices.  Foods that you may want to avoid include: ? Chocolate and cocoa. ? Peppermint and mint flavorings. ? Garlic, onions, and horseradish. ? Spicy and acidic foods, including peppers, chili powder, curry powder, vinegar, hot sauces, and barbecue sauce. ? Citrus fruits, such as oranges, lemons, and limes. ? Tomato-based foods, such as red sauce, chili, and salsa. ? Fried and fatty foods, such as donuts, french fries, potato chips, and high-fat dressings. ? High-fat meats, such as hot dogs, cold cuts, sausage, ham, and bacon. ? High-fat dairy items, such as whole milk, butter, and cheese.  Eat small, frequent meals instead of large meals.  Avoid drinking large amounts of liquid with your meals.  Avoid eating meals during the 2-3 hours before bedtime.  Avoid lying down right after you eat.  Do not exercise right after you eat. Medicines  Take over-the-counter and prescription medicines only as told by your health care provider.  Do not take aspirin, ibuprofen, or other NSAIDs unless your health care provider tells you to do that.  You may be instructed to avoid medicines that contain sodium bicarbonate. General instructions  If directed, raise the head of your  bed about 6 inches (15 cm) by putting blocks under the legs. Sleeping with more pillows does not effectively relieve heartburn because it only changes the position of your head.  Do not use any products that contain nicotine or tobacco, such as cigarettes and e-cigarettes. If you need help quitting, ask your health care provider.  Wear loose-fitting clothing.  Try to reduce your stress, such as with yoga or meditation. If you need help managing stress, ask your health care provider.  Maintain a healthy weight. If you are overweight, work with your health care provider to safely lose weight.  Keep all follow-up visits as told by your health care provider. This is important. Contact a health care provider if:  You develop new symptoms.  Your symptoms do not improve with treatment.  You have unexplained weight loss.  You have difficulty swallowing.  You make loud sounds when you breathe (wheeze).  You have a cough that does not go away.  You have frequent heartburn for more than 2 weeks.  You have nausea or vomiting that does not get better with treatment.  You have pain in your abdomen. Get help right away if:  You have severe chest pain that spreads to your arm, neck, or jaw.  You feel sweaty, dizzy, or light-headed.  You have shortness of breath.  You have pain when swallowing.  You vomit, and your vomit looks like blood or coffee  grounds.  Your stool is bloody or black. This information is not intended to replace advice given to you by your health care provider. Make sure you discuss any questions you have with your health care provider. Document Released: 03/27/2000 Document Revised: 12/16/2015 Document Reviewed: 12/16/2015 Elsevier Interactive Patient Education  2018 [employer].

## 2016-11-03 ENCOUNTER — Telehealth: Payer: Self-pay | Admitting: Certified Nurse Midwife

## 2016-11-03 ENCOUNTER — Encounter: Payer: BLUE CROSS/BLUE SHIELD | Admitting: Certified Nurse Midwife

## 2016-11-03 NOTE — Telephone Encounter (Signed)
Patient lvm to reschedule appointment, I called and lvm for patient to call back and schedule appointment. Thank you.

## 2016-11-10 ENCOUNTER — Encounter: Payer: BLUE CROSS/BLUE SHIELD | Admitting: Certified Nurse Midwife

## 2016-11-11 ENCOUNTER — Ambulatory Visit (INDEPENDENT_AMBULATORY_CARE_PROVIDER_SITE_OTHER): Payer: BLUE CROSS/BLUE SHIELD | Admitting: Certified Nurse Midwife

## 2016-11-11 VITALS — BP 123/76 | HR 99 | Wt 203.2 lb

## 2016-11-11 DIAGNOSIS — Z3493 Encounter for supervision of normal pregnancy, unspecified, third trimester: Secondary | ICD-10-CM

## 2016-11-11 LAB — POCT URINALYSIS DIPSTICK
Bilirubin, UA: NEGATIVE
Blood, UA: NEGATIVE
GLUCOSE UA: NEGATIVE
Ketones, UA: NEGATIVE
NITRITE UA: NEGATIVE
PH UA: 6 (ref 5.0–8.0)
Spec Grav, UA: 1.01 (ref 1.010–1.025)
UROBILINOGEN UA: 0.2 U/dL

## 2016-11-11 NOTE — Patient Instructions (Signed)

## 2016-11-11 NOTE — Progress Notes (Signed)
[name], doing well. States that the Effexor is working well for her. Discussed weight gain and goal. Encouraged walking 3-4 times week for 30 min, discussed diet. Birth plan reviewed. Copy obtained and sent to scan into chart.Reviewed GBS @ 36 wks. Follow up 2 wks.  Doreene BurkeAnnie Gable Odonohue, CNM

## 2016-11-12 ENCOUNTER — Telehealth: Payer: Self-pay | Admitting: Obstetrics and Gynecology

## 2016-11-12 NOTE — Telephone Encounter (Signed)
Patient called stating her stomach is hardening where the baby is and is uncomfortable. She didn't know if this is normal. Thanks

## 2016-11-13 ENCOUNTER — Ambulatory Visit (INDEPENDENT_AMBULATORY_CARE_PROVIDER_SITE_OTHER): Payer: BLUE CROSS/BLUE SHIELD | Admitting: Certified Nurse Midwife

## 2016-11-13 VITALS — BP 130/81 | HR 95 | Wt 204.0 lb

## 2016-11-13 DIAGNOSIS — O26893 Other specified pregnancy related conditions, third trimester: Secondary | ICD-10-CM

## 2016-11-13 DIAGNOSIS — Z3403 Encounter for supervision of normal first pregnancy, third trimester: Secondary | ICD-10-CM | POA: Diagnosis not present

## 2016-11-13 DIAGNOSIS — Z3689 Encounter for other specified antenatal screening: Secondary | ICD-10-CM

## 2016-11-13 DIAGNOSIS — N898 Other specified noninflammatory disorders of vagina: Secondary | ICD-10-CM

## 2016-11-13 DIAGNOSIS — O479 False labor, unspecified: Secondary | ICD-10-CM

## 2016-11-13 LAB — POCT URINALYSIS DIPSTICK
BILIRUBIN UA: NEGATIVE
Blood, UA: NEGATIVE
GLUCOSE UA: NEGATIVE
KETONES UA: NEGATIVE
LEUKOCYTES UA: NEGATIVE
NITRITE UA: NEGATIVE
PH UA: 6.5 (ref 5.0–8.0)
Spec Grav, UA: 1.015 (ref 1.010–1.025)
Urobilinogen, UA: 0.2 E.U./dL

## 2016-11-13 NOTE — Telephone Encounter (Signed)
Advised pt could be braxton hicks and to increase water intake

## 2016-11-13 NOTE — Progress Notes (Signed)
Subjective:   Janet Rivera is a 26 y.o. G1P0000 7932w3d being seen today for work in problem visit.    Patient reports greater than six (6) contractions per hour for the last hour, no relief with home treatment measures. Contractions are not painful. Pt is able to walk and talk during them.   Denies vaginal bleeding or leaking of fluid.  Reports good fetal movement.  Denies difficulty breathing or respiratory distress, chest pain, abdominal pain, dysuria, and leg pain or swelling.   The following portions of the patient's history were reviewed and updated as appropriate: allergies, current medications, past family history, past medical history, past social history, past surgical history and problem list.   Review of systems:   ROS negative except as noted above. Information obtained from patient and spouse.   Objective:   BP 130/81   Pulse 95   Wt 204 lb (92.5 kg)   LMP 03/12/2016 (Exact Date)   BMI 36.14 kg/m   Uterine Size: Fundal Height: 32 cm  Fetal Movement: Movement: Present   NST performed today was reviewed and was found to be reactive. Baseline 150 with moderate variability, accelerations present and no deceleration noted.  Abdomen:  soft, gravid, appropriate for gestational age,non-tender  Vaginal:  Discharge, white and thin    Cervix: Visual closed   Urine dipstick shows negative for nitrites, leukocytes, red blood cells, glucose, ketones, urobilinogen, positive for protein.  No results found for this or any previous visit (from the past 24 hour(s)).  Assessment:   Pregnancy:  G1P0000 at 8832w3d  1. Encounter for supervision of normal first pregnancy in third trimester  - POCT urinalysis dipstick - NuSwab Vaginitis (VG)  2. Braxton Hick's contraction   3. NST (non-stress test) reactive   4. Vaginal discharge during pregnancy in third trimester  - NuSwab Vaginitis (VG)  Plan:   Labs: NuSwab collected, will contact patient via MyChart with results.    Preterm labor symptoms: vaginal bleeding, contractions and leaking of fluid reviewed in detail.  Fetal movement precautions reviewed.  Follow up as previously scheduled.    Gunnar BullaJenkins Michelle Mikaia Janvier, CNM

## 2016-11-13 NOTE — Patient Instructions (Addendum)
Braxton Hicks Contractions Contractions of the uterus can occur throughout pregnancy, but they are not always a sign that you are in labor. You may have practice contractions called Braxton Hicks contractions. These false labor contractions are sometimes confused with true labor. What are Braxton Hicks contractions? Braxton Hicks contractions are tightening movements that occur in the muscles of the uterus before labor. Unlike true labor contractions, these contractions do not result in opening (dilation) and thinning of the cervix. Toward the end of pregnancy (32-34 weeks), Braxton Hicks contractions can happen more often and may become stronger. These contractions are sometimes difficult to tell apart from true labor because they can be very uncomfortable. You should not feel embarrassed if you go to the hospital with false labor. Sometimes, the only way to tell if you are in true labor is for your health care provider to look for changes in the cervix. The health care provider will do a physical exam and may monitor your contractions. If you are not in true labor, the exam should show that your cervix is not dilating and your water has not broken. If there are no prenatal problems or other health problems associated with your pregnancy, it is completely safe for you to be sent home with false labor. You may continue to have Braxton Hicks contractions until you go into true labor. How can I tell the difference between true labor and false labor?  Differences  False labor  Contractions last 30-70 seconds.: Contractions are usually shorter and not as strong as true labor contractions.  Contractions become very regular.: Contractions are usually irregular.  Discomfort is usually felt in the top of the uterus, and it spreads to the lower abdomen and low back.: Contractions are often felt in the front of the lower abdomen and in the groin.  Contractions do not go away with walking.: Contractions may  go away when you walk around or change positions while lying down.  Contractions usually become more intense and increase in frequency.: Contractions get weaker and are shorter-lasting as time goes on.  The cervix dilates and gets thinner.: The cervix usually does not dilate or become thin. Follow these instructions at home:  Take over-the-counter and prescription medicines only as told by your health care provider.  Keep up with your usual exercises and follow other instructions from your health care provider.  Eat and drink lightly if you think you are going into labor.  If Braxton Hicks contractions are making you uncomfortable:  Change your position from lying down or resting to walking, or change from walking to resting.  Sit and rest in a tub of warm water.  Drink enough fluid to keep your urine clear or pale yellow. Dehydration may cause these contractions.  Do slow and deep breathing several times an hour.  Keep all follow-up prenatal visits as told by your health care provider. This is important. Contact a health care provider if:  You have a fever.  You have continuous pain in your abdomen. Get help right away if:  Your contractions become stronger, more regular, and closer together.  You have fluid leaking or gushing from your vagina.  You pass blood-tinged mucus (bloody show).  You have bleeding from your vagina.  You have low back pain that you never had before.  You feel your baby's head pushing down and causing pelvic pressure.  Your baby is not moving inside you as much as it used to. Summary  Contractions that occur before labor are   called Braxton Hicks contractions, false labor, or practice contractions.  Braxton Hicks contractions are usually shorter, weaker, farther apart, and less regular than true labor contractions. True labor contractions usually become progressively stronger and regular and they become more frequent.  Manage discomfort from  Braxton Hicks contractions by changing position, resting in a warm bath, drinking plenty of water, or practicing deep breathing. This information is not intended to replace advice given to you by your health care provider. Make sure you discuss any questions you have with your health care provider. Document Released: 03/30/2005 Document Revised: 02/17/2016 Document Reviewed: 02/17/2016 [employer] Interactive Patient Education  2017 [employer] Third Trimester of Pregnancy The third trimester is from week 28 through week 40 (months 7 through 9). The third trimester is a time when the unborn baby (fetus) is growing rapidly. At the end of the ninth month, the fetus is about 20 inches in length and weighs 6-10 pounds. Body changes during your third trimester Your body will continue to go through many changes during pregnancy. The changes vary from woman to woman. During the third trimester:  Your weight will continue to increase. You can expect to gain 25-35 pounds (11-16 kg) by the end of the pregnancy.  You may begin to get stretch marks on your hips, abdomen, and breasts.  You may urinate more often because the fetus is moving lower into your pelvis and pressing on your bladder.  You may develop or continue to have heartburn. This is caused by increased hormones that slow down muscles in the digestive tract.  You may develop or continue to have constipation because increased hormones slow digestion and cause the muscles that push waste through your intestines to relax.  You may develop hemorrhoids. These are swollen veins (varicose veins) in the rectum that can itch or be painful.  You may develop swollen, bulging veins (varicose veins) in your legs.  You may have increased body aches in the pelvis, back, or thighs. This is due to weight gain and increased hormones that are relaxing your joints.  You may have changes in your hair. These can include thickening of your hair, rapid growth, and  changes in texture. Some women also have hair loss during or after pregnancy, or hair that feels dry or thin. Your hair will most likely return to normal after your baby is born.  Your breasts will continue to grow and they will continue to become tender. A yellow fluid (colostrum) may leak from your breasts. This is the first milk you are producing for your baby.  Your belly button may stick out.  You may notice more swelling in your hands, face, or ankles.  You may have increased tingling or numbness in your hands, arms, and legs. The skin on your belly may also feel numb.  You may feel short of breath because of your expanding uterus.  You may have more problems sleeping. This can be caused by the size of your belly, increased need to urinate, and an increase in your body's metabolism.  You may notice the fetus "dropping," or moving lower in your abdomen (lightening).  You may have increased vaginal discharge.  You may notice your joints feel loose and you may have pain around your pelvic bone. What to expect at prenatal visits You will have prenatal exams every 2 weeks until week 36. Then you will have weekly prenatal exams. During a routine prenatal visit:  You will be weighed to make sure you and the baby   are growing normally.  Your blood pressure will be taken.  Your abdomen will be measured to track your baby's growth.  The fetal heartbeat will be listened to.  Any test results from the previous visit will be discussed.  You may have a cervical check near your due date to see if your cervix has softened or thinned (effaced).  You will be tested for Group B streptococcus. This happens between 35 and 37 weeks. Your health care provider may ask you:  What your birth plan is.  How you are feeling.  If you are feeling the baby move.  If you have had any abnormal symptoms, such as leaking fluid, bleeding, severe headaches, or abdominal cramping.  If you are using any  tobacco products, including cigarettes, chewing tobacco, and electronic cigarettes.  If you have any questions. Other tests or screenings that may be performed during your third trimester include:  Blood tests that check for low iron levels (anemia).  Fetal testing to check the health, activity level, and growth of the fetus. Testing is done if you have certain medical conditions or if there are problems during the pregnancy.  Nonstress test (NST). This test checks the health of your baby to make sure there are no signs of problems, such as the baby not getting enough oxygen. During this test, a belt is placed around your belly. The baby is made to move, and its heart rate is monitored during movement. What is false labor? False labor is a condition in which you feel small, irregular tightenings of the muscles in the womb (contractions) that usually go away with rest, changing position, or drinking water. These are called Braxton Hicks contractions. Contractions may last for hours, days, or even weeks before true labor sets in. If contractions come at regular intervals, become more frequent, increase in intensity, or become painful, you should see your health care provider. What are the signs of labor?  Abdominal cramps.  Regular contractions that start at 10 minutes apart and become stronger and more frequent with time.  Contractions that start on the top of the uterus and spread down to the lower abdomen and back.  Increased pelvic pressure and dull back pain.  A watery or bloody mucus discharge that comes from the vagina.  Leaking of amniotic fluid. This is also known as your "water breaking." It could be a slow trickle or a gush. Let your health care provider know if it has a color or strange odor. If you have any of these signs, call your health care provider right away, even if it is before your due date. Follow these instructions at home: Medicines   Follow your health care  provider's instructions regarding medicine use. Specific medicines may be either safe or unsafe to take during pregnancy.  Take a prenatal vitamin that contains at least 600 micrograms (mcg) of folic acid.  If you develop constipation, try taking a stool softener if your health care provider approves. Eating and drinking   Eat a balanced diet that includes fresh fruits and vegetables, whole grains, good sources of protein such as meat, eggs, or tofu, and low-fat dairy. Your health care provider will help you determine the amount of weight gain that is right for you.  Avoid raw meat and uncooked cheese. These carry germs that can cause birth defects in the baby.  If you have low calcium intake from food, talk to your health care provider about whether you should take a daily calcium supplement.    Eat four or five small meals rather than three large meals a day.  Limit foods that are high in fat and processed sugars, such as fried and sweet foods.  To prevent constipation:  Drink enough fluid to keep your urine clear or pale yellow.  Eat foods that are high in fiber, such as fresh fruits and vegetables, whole grains, and beans. Activity   Exercise only as directed by your health care provider. Most women can continue their usual exercise routine during pregnancy. Try to exercise for 30 minutes at least 5 days a week. Stop exercising if you experience uterine contractions.  Avoid heavy lifting.  Do not exercise in extreme heat or humidity, or at high altitudes.  Wear low-heel, comfortable shoes.  Practice good posture.  You may continue to have sex unless your health care provider tells you otherwise. Relieving pain and discomfort   Take frequent breaks and rest with your legs elevated if you have leg cramps or low back pain.  Take warm sitz baths to soothe any pain or discomfort caused by hemorrhoids. Use hemorrhoid cream if your health care provider approves.  Wear a good  support bra to prevent discomfort from breast tenderness.  If you develop varicose veins:  Wear support pantyhose or compression stockings as told by your healthcare provider.  Elevate your feet for 15 minutes, 3-4 times a day. Prenatal care   Write down your questions. Take them to your prenatal visits.  Keep all your prenatal visits as told by your health care provider. This is important. Safety   Wear your seat belt at all times when driving.  Make a list of emergency phone numbers, including numbers for family, friends, the hospital, and police and fire departments. General instructions   Avoid cat litter boxes and soil used by cats. These carry germs that can cause birth defects in the baby. If you have a cat, ask someone to clean the litter box for you.  Do not travel far distances unless it is absolutely necessary and only with the approval of your health care provider.  Do not use hot tubs, steam rooms, or saunas.  Do not drink alcohol.  Do not use any products that contain nicotine or tobacco, such as cigarettes and e-cigarettes. If you need help quitting, ask your health care provider.  Do not use any medicinal herbs or unprescribed drugs. These chemicals affect the formation and growth of the baby.  Do not douche or use tampons or scented sanitary pads.  Do not cross your legs for long periods of time.  To prepare for the arrival of your baby:  Take prenatal classes to understand, practice, and ask questions about labor and delivery.  Make a trial run to the hospital.  Visit the hospital and tour the maternity area.  Arrange for maternity or paternity leave through employers.  Arrange for family and friends to take care of pets while you are in the hospital.  Purchase a rear-facing car seat and make sure you know how to install it in your car.  Pack your hospital bag.  Prepare the baby's nursery. Make sure to remove all pillows and stuffed animals from  the baby's crib to prevent suffocation.  Visit your dentist if you have not gone during your pregnancy. Use a soft toothbrush to brush your teeth and be gentle when you floss. Contact a health care provider if:  You are unsure if you are in labor or if your water has broken.  You   become dizzy.  You have mild pelvic cramps, pelvic pressure, or nagging pain in your abdominal area.  You have lower back pain.  You have persistent nausea, vomiting, or diarrhea.  You have an unusual or bad smelling vaginal discharge.  You have pain when you urinate. Get help right away if:  Your water breaks before 37 weeks.  You have regular contractions less than 5 minutes apart before 37 weeks.  You have a fever.  You are leaking fluid from your vagina.  You have spotting or bleeding from your vagina.  You have severe abdominal pain or cramping.  You have rapid weight loss or weight gain.  You have shortness of breath with chest pain.  You notice sudden or extreme swelling of your face, hands, ankles, feet, or legs.  Your baby makes fewer than 10 movements in 2 hours.  You have severe headaches that do not go away when you take medicine.  You have vision changes. Summary  The third trimester is from week 28 through week 40, months 7 through 9. The third trimester is a time when the unborn baby (fetus) is growing rapidly.  During the third trimester, your discomfort may increase as you and your baby continue to gain weight. You may have abdominal, leg, and back pain, sleeping problems, and an increased need to urinate.  During the third trimester your breasts will keep growing and they will continue to become tender. A yellow fluid (colostrum) may leak from your breasts. This is the first milk you are producing for your baby.  False labor is a condition in which you feel small, irregular tightenings of the muscles in the womb (contractions) that eventually go away. These are called  Braxton Hicks contractions. Contractions may last for hours, days, or even weeks before true labor sets in.  Signs of labor can include: abdominal cramps; regular contractions that start at 10 minutes apart and become stronger and more frequent with time; watery or bloody mucus discharge that comes from the vagina; increased pelvic pressure and dull back pain; and leaking of amniotic fluid. This information is not intended to replace advice given to you by your health care provider. Make sure you discuss any questions you have with your health care provider. Document Released: 03/24/2001 Document Revised: 09/05/2015 Document Reviewed: 05/31/2012 [employer] Interactive Patient Education  2017 [employer]  

## 2016-11-18 LAB — NUSWAB VAGINITIS (VG)
CANDIDA ALBICANS, NAA: NEGATIVE
CANDIDA GLABRATA, NAA: NEGATIVE
TRICH VAG BY NAA: NEGATIVE

## 2016-11-19 ENCOUNTER — Encounter: Payer: Self-pay | Admitting: Certified Nurse Midwife

## 2016-11-22 ENCOUNTER — Other Ambulatory Visit: Payer: Self-pay | Admitting: Obstetrics and Gynecology

## 2016-11-23 ENCOUNTER — Other Ambulatory Visit: Payer: Self-pay | Admitting: Certified Nurse Midwife

## 2016-11-25 ENCOUNTER — Ambulatory Visit (INDEPENDENT_AMBULATORY_CARE_PROVIDER_SITE_OTHER): Payer: BLUE CROSS/BLUE SHIELD | Admitting: Obstetrics and Gynecology

## 2016-11-25 VITALS — BP 132/86 | HR 92 | Wt 210.5 lb

## 2016-11-25 DIAGNOSIS — Z3493 Encounter for supervision of normal pregnancy, unspecified, third trimester: Secondary | ICD-10-CM

## 2016-11-25 LAB — POCT URINALYSIS DIPSTICK
BILIRUBIN UA: NEGATIVE
Glucose, UA: NEGATIVE
KETONES UA: NEGATIVE
Leukocytes, UA: NEGATIVE
Nitrite, UA: NEGATIVE
PH UA: 6.5 (ref 5.0–8.0)
Protein, UA: NEGATIVE
RBC UA: NEGATIVE
SPEC GRAV UA: 1.01 (ref 1.010–1.025)
Urobilinogen, UA: 0.2 E.U./dL

## 2016-11-25 NOTE — Progress Notes (Signed)
[name]-discussed PIH precautions. Reminded to use compression socks daily.

## 2016-11-25 NOTE — Progress Notes (Signed)
[name]- pt is having B feet swelling, she is having headaches, lots of pelvic pressure

## 2016-12-03 ENCOUNTER — Ambulatory Visit (INDEPENDENT_AMBULATORY_CARE_PROVIDER_SITE_OTHER): Payer: BLUE CROSS/BLUE SHIELD | Admitting: Certified Nurse Midwife

## 2016-12-03 ENCOUNTER — Observation Stay
Admission: EM | Admit: 2016-12-03 | Discharge: 2016-12-03 | Disposition: A | Discharge status: Home / Self Care | Payer: BLUE CROSS/BLUE SHIELD | Attending: Certified Nurse Midwife | Admitting: Certified Nurse Midwife

## 2016-12-03 ENCOUNTER — Encounter: Payer: Self-pay | Admitting: Certified Nurse Midwife

## 2016-12-03 ENCOUNTER — Encounter: Payer: Self-pay | Admitting: *Deleted

## 2016-12-03 VITALS — BP 130/82 | HR 94 | Wt 208.8 lb

## 2016-12-03 DIAGNOSIS — Z113 Encounter for screening for infections with a predominantly sexual mode of transmission: Secondary | ICD-10-CM

## 2016-12-03 DIAGNOSIS — F419 Anxiety disorder, unspecified: Secondary | ICD-10-CM | POA: Insufficient documentation

## 2016-12-03 DIAGNOSIS — Z3A36 36 weeks gestation of pregnancy: Secondary | ICD-10-CM

## 2016-12-03 DIAGNOSIS — O368131 Decreased fetal movements, third trimester, fetus 1: Secondary | ICD-10-CM | POA: Diagnosis not present

## 2016-12-03 DIAGNOSIS — Z3689 Encounter for other specified antenatal screening: Secondary | ICD-10-CM

## 2016-12-03 DIAGNOSIS — O368130 Decreased fetal movements, third trimester, not applicable or unspecified: Principal | ICD-10-CM | POA: Insufficient documentation

## 2016-12-03 DIAGNOSIS — O99343 Other mental disorders complicating pregnancy, third trimester: Secondary | ICD-10-CM | POA: Diagnosis not present

## 2016-12-03 DIAGNOSIS — Z3493 Encounter for supervision of normal pregnancy, unspecified, third trimester: Secondary | ICD-10-CM

## 2016-12-03 DIAGNOSIS — Z369 Encounter for antenatal screening, unspecified: Secondary | ICD-10-CM

## 2016-12-03 LAB — POCT URINALYSIS DIPSTICK
BILIRUBIN UA: NEGATIVE
Blood, UA: NEGATIVE
GLUCOSE UA: NEGATIVE
Ketones, UA: NEGATIVE
Nitrite, UA: NEGATIVE
PH UA: 6 (ref 5.0–8.0)
Protein, UA: NEGATIVE
Spec Grav, UA: 1.01 (ref 1.010–1.025)
Urobilinogen, UA: 0.2 E.U./dL

## 2016-12-03 NOTE — Patient Instructions (Signed)
Vaginal Delivery Vaginal delivery means that you will give birth by pushing your baby out of your birth canal (vagina). A team of health care providers will help you before, during, and after vaginal delivery. Birth experiences are unique for every woman and every pregnancy, and birth experiences vary depending on where you choose to give birth. What should I do to prepare for my baby's birth? Before your baby is born, it is important to talk with your health care provider about:  Your labor and delivery preferences. These may include: ? Medicines that you may be given. ? How you will manage your pain. This might include non-medical pain relief techniques or injectable pain relief such as epidural analgesia. ? How you and your baby will be monitored during labor and delivery. ? Who may be in the labor and delivery room with you. ? Your feelings about surgical delivery of your baby (cesarean delivery, or C-section) if this becomes necessary. ? Your feelings about receiving donated blood through an IV tube (blood transfusion) if this becomes necessary.  Whether you are able: ? To take pictures or videos of the birth. ? To eat during labor and delivery. ? To move around, walk, or change positions during labor and delivery.  What to expect after your baby is born, such as: ? Whether delayed umbilical cord clamping and cutting is offered. ? Who will care for your baby right after birth. ? Medicines or tests that may be recommended for your baby. ? Whether breastfeeding is supported in your hospital or birth center. ? How long you will be in the hospital or birth center.  How any medical conditions you have may affect your baby or your labor and delivery experience.  To prepare for your baby's birth, you should also:  Attend all of your health care visits before delivery (prenatal visits) as recommended by your health care provider. This is important.  Prepare your home for your baby's  arrival. Make sure that you have: ? Diapers. ? Baby clothing. ? Feeding equipment. ? Safe sleeping arrangements for you and your baby.  Install a car seat in your vehicle. Have your car seat checked by a certified car seat installer to make sure that it is installed safely.  Think about who will help you with your new baby at home for at least the first several weeks after delivery.  What can I expect when I arrive at the birth center or hospital? Once you are in labor and have been admitted into the hospital or birth center, your health care provider may:  Review your pregnancy history and any concerns you have.  Insert an IV tube into one of your veins. This is used to give you fluids and medicines.  Check your blood pressure, pulse, temperature, and heart rate (vital signs).  Check whether your bag of water (amniotic sac) has broken (ruptured).  Talk with you about your birth plan and discuss pain control options.  Monitoring Your health care provider may monitor your contractions (uterine monitoring) and your baby's heart rate (fetal monitoring). You may need to be monitored:  Often, but not continuously (intermittently).  All the time or for long periods at a time (continuously). Continuous monitoring may be needed if: ? You are taking certain medicines, such as medicine to relieve pain or make your contractions stronger. ? You have pregnancy or labor complications.  Monitoring may be done by:  Placing a special stethoscope or a handheld monitoring device on your abdomen to   check your baby's heartbeat, and feeling your abdomen for contractions. This method of monitoring does not continuously record your baby's heartbeat or your contractions.  Placing monitors on your abdomen (external monitors) to record your baby's heartbeat and the frequency and length of contractions. You may not have to wear external monitors all the time.  Placing monitors inside of your uterus  (internal monitors) to record your baby's heartbeat and the frequency, length, and strength of your contractions. ? Your health care provider may use internal monitors if he or she needs more information about the strength of your contractions or your baby's heart rate. ? Internal monitors are put in place by passing a thin, flexible wire through your vagina and into your uterus. Depending on the type of monitor, it may remain in your uterus or on your baby's head until birth. ? Your health care provider will discuss the benefits and risks of internal monitoring with you and will ask for your permission before inserting the monitors.  Telemetry. This is a type of continuous monitoring that can be done with external or internal monitors. Instead of having to stay in bed, you are able to move around during telemetry. Ask your health care provider if telemetry is an option for you.  Physical exam Your health care provider may perform a physical exam. This may include:  Checking whether your baby is positioned: ? With the head toward your vagina (head-down). This is most common. ? With the head toward the top of your uterus (head-up or breech). If your baby is in a breech position, your health care provider may try to turn your baby to a head-down position so you can deliver vaginally. If it does not seem that your baby can be born vaginally, your provider may recommend surgery to deliver your baby. In rare cases, you may be able to deliver vaginally if your baby is head-up (breech delivery). ? Lying sideways (transverse). Babies that are lying sideways cannot be delivered vaginally.  Checking your cervix to determine: ? Whether it is thinning out (effacing). ? Whether it is opening up (dilating). ? How low your baby has moved into your birth canal.  What are the three stages of labor and delivery?  Normal labor and delivery is divided into the following three stages: Stage 1  Stage 1 is the  longest stage of labor, and it can last for hours or days. Stage 1 includes: ? Early labor. This is when contractions may be irregular, or regular and mild. Generally, early labor contractions are more than 10 minutes apart. ? Active labor. This is when contractions get longer, more regular, more frequent, and more intense. ? The transition phase. This is when contractions happen very close together, are very intense, and may last longer than during any other part of labor.  Contractions generally feel mild, infrequent, and irregular at first. They get stronger, more frequent (about every 2-3 minutes), and more regular as you progress from early labor through active labor and transition.  Many women progress through stage 1 naturally, but you may need help to continue making progress. If this happens, your health care provider may talk with you about: ? Rupturing your amniotic sac if it has not ruptured yet. ? Giving you medicine to help make your contractions stronger and more frequent.  Stage 1 ends when your cervix is completely dilated to 4 inches (10 cm) and completely effaced. This happens at the end of the transition phase. Stage 2  Once   your cervix is completely effaced and dilated to 4 inches (10 cm), you may start to feel an urge to push. It is common for the body to naturally take a rest before feeling the urge to push, especially if you received an epidural or certain other pain medicines. This rest period may last for up to 1-2 hours, depending on your unique labor experience.  During stage 2, contractions are generally less painful, because pushing helps relieve contraction pain. Instead of contraction pain, you may feel stretching and burning pain, especially when the widest part of your baby's head passes through the vaginal opening (crowning).  Your health care provider will closely monitor your pushing progress and your baby's progress through the vagina during stage 2.  Your  health care provider may massage the area of skin between your vaginal opening and anus (perineum) or apply warm compresses to your perineum. This helps it stretch as the baby's head starts to crown, which can help prevent perineal tearing. ? In some cases, an incision may be made in your perineum (episiotomy) to allow the baby to pass through the vaginal opening. An episiotomy helps to make the opening of the vagina larger to allow more room for the baby to fit through.  It is very important to breathe and focus so your health care provider can control the delivery of your baby's head. Your health care provider may have you decrease the intensity of your pushing, to help prevent perineal tearing.  After delivery of your baby's head, the shoulders and the rest of the body generally deliver very quickly and without difficulty.  Once your baby is delivered, the umbilical cord may be cut right away, or this may be delayed for 1-2 minutes, depending on your baby's health. This may vary among health care providers, hospitals, and birth centers.  If you and your baby are healthy enough, your baby may be placed on your chest or abdomen to help maintain the baby's temperature and to help you bond with each other. Some mothers and babies start breastfeeding at this time. Your health care team will dry your baby and help keep your baby warm during this time.  Your baby may need immediate care if he or she: ? Showed signs of distress during labor. ? Has a medical condition. ? Was born too early (prematurely). ? Had a bowel movement before birth (meconium). ? Shows signs of difficulty transitioning from being inside the uterus to being outside of the uterus. If you are planning to breastfeed, your health care team will help you begin a feeding. Stage 3  The third stage of labor starts immediately after the birth of your baby and ends after you deliver the placenta. The placenta is an organ that develops  during pregnancy to provide oxygen and nutrients to your baby in the womb.  Delivering the placenta may require some pushing, and you may have mild contractions. Breastfeeding can stimulate contractions to help you deliver the placenta.  After the placenta is delivered, your uterus should tighten (contract) and become firm. This helps to stop bleeding in your uterus. To help your uterus contract and to control bleeding, your health care provider may: ? Give you medicine by injection, through an IV tube, by mouth, or through your rectum (rectally). ? Massage your abdomen or perform a vaginal exam to remove any blood clots that are left in your uterus. ? Empty your bladder by placing a thin, flexible tube (catheter) into your bladder. ? Encourage   you to breastfeed your baby. After labor is over, you and your baby will be monitored closely to ensure that you are both healthy until you are ready to go home. Your health care team will teach you how to care for yourself and your baby. This information is not intended to replace advice given to you by your health care provider. Make sure you discuss any questions you have with your health care provider. Document Released: 01/07/2008 Document Revised: 10/18/2015 Document Reviewed: 04/14/2015 Elsevier Interactive Patient Education  2018 [employer]. Nonstress Test The nonstress test is a procedure that monitors the fetus's heartbeat. The test will monitor the heartbeat when the fetus is at rest and while the fetus is moving. In a healthy fetus, there will be an increase in fetal heart rate when the fetus moves or kicks. The heart rate will decrease at rest. This test helps determine if the fetus is healthy. Your health care provider will look at a number of patterns in the heart rate tracing to make sure your baby is thriving. If there is concern, your health care provider may order additional tests or may suggest another course of action. This test is often  done in the third trimester and can help determine if an early delivery is needed and safe. Common reasons to have this test are:  You are past your due date.  You have a high-risk pregnancy.  You are feeling less movement than normal.  You have lost a pregnancy in the past.  Your health care provider suspects fetal growth problems.  You have too much or too little amniotic fluid.  What happens before the procedure?  Eat a meal right before the test or as directed by your health care provider. Food may help stimulate fetal movements.  Use the restroom right before the test. What happens during the procedure?  Two belts will be placed around your abdomen. These belts have monitors attached to them. One records the fetal heart rate and the other records uterine contractions.  You may be asked to lie down on your side or to stay sitting upright.  You may be given a button to press when you feel movement.  The fetal heartbeat is listened to and watched on a screen. The heartbeat is recorded on a sheet of paper.  If the fetus seems to be sleeping, you may be asked to drink some juice or soda, gently press your abdomen, or make some noise to wake the fetus. What happens after the procedure? Your health care provider will discuss the test results with you and make recommendations for the near future.  This information is not intended to replace advice given to you by your health care provider. Make sure you discuss any questions you have with your health care provider. This information is not intended to replace advice given to you by your health care provider. Make sure you discuss any questions you have with your health care provider. Document Released: 03/20/2002 Document Revised: 02/28/2016 Document Reviewed: 05/03/2012 Elsevier Interactive Patient Education  [employer].

## 2016-12-03 NOTE — OB Triage Note (Signed)
Sent from office for monitoring  Due to nonreactive NST there.

## 2016-12-03 NOTE — Discharge Summary (Signed)
  Physician Obstetric Discharge Summary  Patient ID: Felichia Bridenstine MRN: [id number] DOB/AGE: [birth year] 26 y.o.   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 G15P0000 female at [redacted]w[redacted]d, EDD Estimated Date of Delivery: 12/29/16 who presented to triage for complaints of decreased fetal movement. She was evaluated by the nurses with no significant findings for labor or fetal distress. An NST was performed and has been reviewed by CNM.    Objective:  VSS  Fetal Assessment:  Mode: External Baseline Rate (A): 135 bpm Variability: Moderate Accelerations: 15 x 15 Decelerations: None  Contraction Frequency (min): occcasional   Fetal movement noted on fetal monitor  Assessment:   Tmia Strzelecki is a 26 y.o. G1P0 at 36 weeks 2 days, evaluated for decreased fetal movement, NOT IN LABOR,   Reactive NST/FHR Category I  Plan:   NST performed was reviewed and was found to be reactive. She was discharged home with bleeding/labor precautions.  Continue routine prenatal care. Follow up with CNM as previously scheduled.    Discharge Instructions: Per After Visit Summary.  Activity: Refer to After Visit Summary  Diet: Regular  Medications: Allergies as of 12/03/2016   No Known Allergies     Medication List    ASK your doctor about these medications   pantoprazole 40 MG tablet Commonly known as:  PROTONIX Take by mouth.   prenatal multivitamin Tabs tablet Take 1 tablet by mouth daily at 12 noon.   ranitidine 150 MG tablet Commonly known as:  ZANTAC Take 1 tablet (150 mg total) by mouth 2 (two) times  daily.   venlafaxine XR 75 MG 24 hr capsule Commonly known as:  EFFEXOR XR Take 1 capsule (75 mg total) by mouth daily with breakfast.      Outpatient follow up:  Follow-up Information    [facility]. Go to.   Why:  regular scheduled appoitment Contact information: [address]  [address] [place] [address] [phone]         Postpartum contraception: oral contraceptives (estrogen/progesterone), Depo-Provera  Discharged Condition: stable  Discharged to: home   Gunnar Bulla, CNM [facility], [facility]

## 2016-12-04 NOTE — Discharge Summary (Signed)
Obstetric Discharge Summary  Patient ID: Janet Rivera MRN: [id number] DOB/AGE: October 07, 1990 26 y.o.   Date of Admission: 12/03/2016 Janet Rivera, CNM Horton Marshall, MD)  Date of Discharge: 12/03/2016 Janet Rivera, CNM Horton Marshall, MD)  Admitting Diagnosis: Observation at [redacted]w[redacted]d  Secondary Diagnosis: Anxiety      Discharge Diagnosis: No other diagnosis   Antepartum Procedures: NST    Brief Hospital Course    L&D OB Triage Note   Subjective:  [name] is a 26 y.o. G67P0000 female at [redacted]w[redacted]d, EDD Estimated Date of Delivery: 12/29/16 who presented to triage for complaints of decreased fetal movement x one (1) day.   Denies vaginal bleeding or leakage of fluid. Endorses good fetal movement.   Denies difficulty breathing or respiratory distress, chest pain, abdominal pain, dysuria, and leg pain.   Objective:  General: Alert and oriented x 4, no apparent distress  Heart: RRR  Lungs: CTAB  Breast: Deferred, no complaints  Abdomen: soft, gravid, obese, non-tender  Lower extremities: generalized, non pitting edema, negative homans sign  Fetal Assessment:  Mode: External Baseline Rate (A): 135 bpm Variability: Moderate Accelerations: 15 x 15 Decelerations: None   Contraction Frequency (min): occcasional  Assessment:  [place], 26 y.o., G1P0 [redacted]w[redacted]d, reactive NST, Braxton Hicks Contractions, NOT IN LABOR  FHR Category I-Reactive NST  Plan:   NST performed was reviewed and was found to be reactive. She was discharged home with bleeding/labor precautions.  Continue routine prenatal care. Follow up with CNM as previously scheduled.   I was present and evaluated this patient in person. JML   Discharge Instructions: Per After Visit Summary.  Activity: Advance as tolerated. Also refer to After Visit Summary  Diet: Regular  Medications: Allergies as of 12/03/2016   No Known Allergies     Medication List    ASK your doctor about these  medications   nystatin-triamcinolone cream Commonly known as:  MYCOLOG II APPLY EXTERNALLY TO THE AFFECTED AREA TWICE DAILY   pantoprazole 40 MG tablet Commonly known as:  PROTONIX Take by mouth.   prenatal multivitamin Tabs tablet Take 1 tablet by mouth daily at 12 noon.   ranitidine 150 MG tablet Commonly known as:  ZANTAC Take 1 tablet (150 mg total) by mouth 2 (two) times daily.   venlafaxine XR 75 MG 24 hr capsule Commonly known as:  EFFEXOR XR Take 1 capsule (75 mg total) by mouth daily with breakfast.      Outpatient follow up:  Follow-up Information    [facility]. Go to.   Why:  regular scheduled appoitment Contact information: [address]  [address] [place] [address] [phone]         Postpartum contraception: oral contraceptives (estrogen/progesterone), Depo-Provera  Discharged Condition: stable  Discharged to: home   Janet Rivera, [place]

## 2016-12-05 LAB — GC/CHLAMYDIA PROBE AMP
CHLAMYDIA, DNA PROBE: NEGATIVE
NEISSERIA GONORRHOEAE BY PCR: NEGATIVE

## 2016-12-05 LAB — STREP GP B NAA: STREP GROUP B AG: NEGATIVE

## 2016-12-06 NOTE — Progress Notes (Signed)
[name]-Reports decreased fetal movement, diarrhea (3 episodes/24 hours), increased vaginal discharge and pelvic pressure, pain with walking, dependent edema and fatigue. Discussed the miseries of pregnancy. 36 week cultures collected. NST performed today was reviewed and was found to be reactive. Baseline 130-140 with Moderate variability; No decels noted. Patient continues to reports decreased fetal movement and is anxious and concerned. Will send to L&D for prolonged fetal monitoring with fetal movement detection capabilities. Reviewed red flag symptoms and when to call. RTC x 1 week for [name] or sooner if needed.

## 2016-12-07 ENCOUNTER — Ambulatory Visit (INDEPENDENT_AMBULATORY_CARE_PROVIDER_SITE_OTHER): Payer: BLUE CROSS/BLUE SHIELD | Admitting: Certified Nurse Midwife

## 2016-12-07 ENCOUNTER — Telehealth: Payer: Self-pay | Admitting: Certified Nurse Midwife

## 2016-12-07 VITALS — BP 128/84 | HR 101 | Wt 208.8 lb

## 2016-12-07 DIAGNOSIS — O1203 Gestational edema, third trimester: Secondary | ICD-10-CM

## 2016-12-07 LAB — POCT URINALYSIS DIPSTICK
BILIRUBIN UA: NEGATIVE
Glucose, UA: NEGATIVE
Ketones, UA: NEGATIVE
NITRITE UA: NEGATIVE
Protein, UA: NEGATIVE
RBC UA: NEGATIVE
Spec Grav, UA: 1.01 (ref 1.010–1.025)
Urobilinogen, UA: 0.2 E.U./dL
pH, UA: 6.5 (ref 5.0–8.0)

## 2016-12-07 NOTE — Progress Notes (Signed)
Pt presents today for problem visit. She states that she has increased swelling in her legs. She has occasional headaches that sometimes resolve with tylenol and she also had "floaters" x 1 yesterday. On exam she has 2+ bilateral pitting edema., 1+ bilateral reflexes , no clonus, no epigastric pain. Urine dip was negative and BP normal. Labs today. Discussed plan of care. SVE per pt request. She agrees to plan. Follow up as scheduled.  Doreene Burke, CNM

## 2016-12-07 NOTE — Telephone Encounter (Signed)
Pt had [name] scheduled with Janet Rivera on 12/09/16. Pt called stating that she wanted to be seen sooner because she has swelling in hands and feet and when she pushing on the area it leaves an indention. After speaking with nurse and provider pt was offered to see Janet Rivera this afternoon at 130 pm. Pt stated that she would be able to make that appt. Thanks TNP

## 2016-12-07 NOTE — Progress Notes (Signed)
Pt states that she is having swelling in hands, ankles and feet. Swelling has been persistent over the past several weeks however notes that over the weekend she has noticed pitting. C/O sme dizziness and headaches intermittently sometimes relieved by Tylenol and sometimes not. Pt states that she has had good water intake.

## 2016-12-07 NOTE — Patient Instructions (Signed)
Preeclampsia and Eclampsia °Preeclampsia is a serious condition that develops only during pregnancy. It is also called toxemia of pregnancy. This condition causes high blood pressure along with other symptoms, such as swelling and headaches. These symptoms may develop as the condition gets worse. Preeclampsia may occur at 20 weeks of pregnancy or later. °Diagnosing and treating preeclampsia early is very important. If not treated early, it can cause serious problems for you and your baby. One problem it can lead to is eclampsia, which is a condition that causes muscle jerking or shaking (convulsions or seizures) in the mother. Delivering your baby is the best treatment for preeclampsia or eclampsia. Preeclampsia and eclampsia symptoms usually go away after your baby is born. °What are the causes? °The cause of preeclampsia is not known. °What increases the risk? °The following risk factors make you more likely to develop preeclampsia: °· Being pregnant for the first time. °· Having had preeclampsia during a past pregnancy. °· Having a family history of preeclampsia. °· Having high blood pressure. °· Being pregnant with twins or triplets. °· Being 35 or older. °· Being African-American. °· Having kidney disease or diabetes. °· Having medical conditions such as lupus or blood diseases. °· Being very overweight (obese). ° °What are the signs or symptoms? °The earliest signs of preeclampsia are: °· High blood pressure. °· Increased protein in your urine. Your health care provider will check for this at every visit before you give birth (prenatal visit). ° °Other symptoms that may develop as the condition gets worse include: °· Severe headaches. °· Sudden weight gain. °· Swelling of the hands, face, legs, and feet. °· Nausea and vomiting. °· Vision problems, such as blurred or double vision. °· Numbness in the face, arms, legs, and feet. °· Urinating less than usual. °· Dizziness. °· Slurred speech. °· Abdominal pain,  especially upper abdominal pain. °· Convulsions or seizures. ° °Symptoms generally go away after giving birth. °How is this diagnosed? °There are no screening tests for preeclampsia. Your health care provider will ask you about symptoms and check for signs of preeclampsia during your prenatal visits. You may also have tests that include: °· Urine tests. °· Blood tests. °· Checking your blood pressure. °· Monitoring your baby’s heart rate. °· Ultrasound. ° °How is this treated? °You and your health care provider will determine the treatment approach that is best for you. Treatment may include: °· Having more frequent prenatal exams to check for signs of preeclampsia, if you have an increased risk for preeclampsia. °· Bed rest. °· Reducing how much salt (sodium) you eat. °· Medicine to lower your blood pressure. °· Staying in the hospital, if your condition is severe. There, treatment will focus on controlling your blood pressure and the amount of fluids in your body (fluid retention). °· You may need to take medicine (magnesium sulfate) to prevent seizures. This medicine may be given as an injection or through an IV tube. °· Delivering your baby early, if your condition gets worse. You may have your labor started with medicine (induced), or you may have a cesarean delivery. ° °Follow these instructions at home: °Eating and drinking ° °· Drink enough fluid to keep your urine clear or pale yellow. °· Eat a healthy diet that is low in sodium. Do not add salt to your food. Check nutrition labels to see how much sodium a food or beverage contains. °· Avoid caffeine. °Lifestyle °· Do not use any products that contain nicotine or tobacco, such as cigarettes   and e-cigarettes. If you need help quitting, ask your health care provider. °· Do not use alcohol or drugs. °· Avoid stress as much as possible. Rest and get plenty of sleep. °General instructions °· Take over-the-counter and prescription medicines only as told by your  health care provider. °· When lying down, lie on your side. This keeps pressure off of your baby. °· When sitting or lying down, raise (elevate) your feet. Try putting some pillows underneath your lower legs. °· Exercise regularly. Ask your health care provider what kinds of exercise are best for you. °· Keep all follow-up and prenatal visits as told by your health care provider. This is important. °How is this prevented? °To prevent preeclampsia or eclampsia from developing during another pregnancy: °· Get proper medical care during pregnancy. Your health care provider may be able to prevent preeclampsia or diagnose and treat it early. °· Your health care provider may have you take a low-dose aspirin or a calcium supplement during your next pregnancy. °· You may have tests of your blood pressure and kidney function after giving birth. °· Maintain a healthy weight. Ask your health care provider for help managing weight gain during pregnancy. °· Work with your health care provider to manage any long-term (chronic) health conditions you have, such as diabetes or kidney problems. ° °Contact a health care provider if: °· You gain more weight than expected. °· You have headaches. °· You have nausea or vomiting. °· You have abdominal pain. °· You feel dizzy or light-headed. °Get help right away if: °· You develop sudden or severe swelling anywhere in your body. This usually happens in the legs. °· You gain 5 lbs (2.3 kg) or more during one week. °· You have severe: °? Abdominal pain. °? Headaches. °? Dizziness. °? Vision problems. °? Confusion. °? Nausea or vomiting. °· You have a seizure. °· You have trouble moving any part of your body. °· You develop numbness in any part of your body. °· You have trouble speaking. °· You have any abnormal bleeding. °· You pass out. °This information is not intended to replace advice given to you by your health care provider. Make sure you discuss any questions you have with your health  care provider. °Document Released: 03/27/2000 Document Revised: 11/26/2015 Document Reviewed: 11/04/2015 °Elsevier Interactive Patient Education © 2018 [employer] ° °

## 2016-12-08 ENCOUNTER — Encounter: Payer: Self-pay | Admitting: Certified Nurse Midwife

## 2016-12-08 LAB — COMPREHENSIVE METABOLIC PANEL
ALBUMIN: 3.5 g/dL (ref 3.5–5.5)
ALK PHOS: 193 IU/L — AB (ref 39–117)
ALT: 10 IU/L (ref 0–32)
AST: 16 IU/L (ref 0–40)
Albumin/Globulin Ratio: 1.3 (ref 1.2–2.2)
BUN / CREAT RATIO: 15 (ref 9–23)
BUN: 8 mg/dL (ref 6–20)
CHLORIDE: 107 mmol/L — AB (ref 96–106)
CO2: 19 mmol/L — AB (ref 20–29)
CREATININE: 0.55 mg/dL — AB (ref 0.57–1.00)
Calcium: 8.9 mg/dL (ref 8.7–10.2)
GFR calc Af Amer: 151 mL/min/{1.73_m2} (ref >59)
GFR calc non Af Amer: 131 mL/min/{1.73_m2} (ref >59)
GLOBULIN, TOTAL: 2.7 g/dL (ref 1.5–4.5)
GLUCOSE: 83 mg/dL (ref 65–99)
Potassium: 4.2 mmol/L (ref 3.5–5.2)
SODIUM: 138 mmol/L (ref 134–144)
Total Protein: 6.2 g/dL (ref 6.0–8.5)

## 2016-12-08 LAB — CBC WITH DIFFERENTIAL/PLATELET
BASOS: 0 %
Basophils Absolute: 0 10*3/uL (ref 0.0–0.2)
EOS (ABSOLUTE): 0.9 10*3/uL — ABNORMAL HIGH (ref 0.0–0.4)
EOS: 11 %
HEMATOCRIT: 30.8 % — AB (ref 34.0–46.6)
HEMOGLOBIN: 10.2 g/dL — AB (ref 11.1–15.9)
IMMATURE GRANULOCYTES: 1 %
Immature Grans (Abs): 0.1 10*3/uL (ref 0.0–0.1)
Lymphocytes Absolute: 1.8 10*3/uL (ref 0.7–3.1)
Lymphs: 21 %
MCH: 27.1 pg (ref 26.6–33.0)
MCHC: 33.1 g/dL (ref 31.5–35.7)
MCV: 82 fL (ref 79–97)
MONOCYTES: 11 %
MONOS ABS: 1 10*3/uL — AB (ref 0.1–0.9)
NEUTROS PCT: 56 %
Neutrophils Absolute: 4.8 10*3/uL (ref 1.4–7.0)
Platelets: 262 10*3/uL (ref 150–379)
RBC: 3.77 x10E6/uL (ref 3.77–5.28)
RDW: 15.4 % (ref 12.3–15.4)
WBC: 8.7 10*3/uL (ref 3.4–10.8)

## 2016-12-08 LAB — PROTEIN / CREATININE RATIO, URINE
CREATININE, UR: 71.2 mg/dL
PROTEIN UR: 15.9 mg/dL
Protein/Creat Ratio: 223 mg/g creat — ABNORMAL HIGH (ref 0–200)

## 2016-12-09 ENCOUNTER — Encounter: Payer: BLUE CROSS/BLUE SHIELD | Admitting: Certified Nurse Midwife

## 2016-12-09 ENCOUNTER — Ambulatory Visit (INDEPENDENT_AMBULATORY_CARE_PROVIDER_SITE_OTHER): Payer: BLUE CROSS/BLUE SHIELD | Admitting: Certified Nurse Midwife

## 2016-12-09 ENCOUNTER — Encounter: Payer: Self-pay | Admitting: Certified Nurse Midwife

## 2016-12-09 VITALS — BP 133/81 | HR 90 | Wt 209.8 lb

## 2016-12-09 DIAGNOSIS — Z3493 Encounter for supervision of normal pregnancy, unspecified, third trimester: Secondary | ICD-10-CM

## 2016-12-09 LAB — POCT URINALYSIS DIPSTICK
Bilirubin, UA: NEGATIVE
Blood, UA: NEGATIVE
Glucose, UA: NEGATIVE
Ketones, UA: NEGATIVE
Nitrite, UA: NEGATIVE
PH UA: 6 (ref 5.0–8.0)
PROTEIN UA: NEGATIVE
SPEC GRAV UA: 1.01 (ref 1.010–1.025)
Urobilinogen, UA: 0.2 E.U./dL

## 2016-12-09 NOTE — Progress Notes (Signed)
[name], doing well. No complaints. Discussed labs results for last visit. No change in symptoms, she denis headache, visiual change , and epigastric pain, Reflexes 1+ bilaterally, no clonus. Follow up 1 wk with Melody.   Doreene Burke, CNM

## 2016-12-09 NOTE — Patient Instructions (Signed)

## 2016-12-15 ENCOUNTER — Encounter: Payer: BLUE CROSS/BLUE SHIELD | Admitting: Certified Nurse Midwife

## 2016-12-15 ENCOUNTER — Ambulatory Visit (INDEPENDENT_AMBULATORY_CARE_PROVIDER_SITE_OTHER): Payer: BLUE CROSS/BLUE SHIELD | Admitting: Certified Nurse Midwife

## 2016-12-15 ENCOUNTER — Encounter: Payer: Self-pay | Admitting: Certified Nurse Midwife

## 2016-12-15 VITALS — BP 134/87 | HR 92 | Wt 210.6 lb

## 2016-12-15 DIAGNOSIS — Z3493 Encounter for supervision of normal pregnancy, unspecified, third trimester: Secondary | ICD-10-CM

## 2016-12-15 LAB — POCT URINALYSIS DIPSTICK
BILIRUBIN UA: NEGATIVE
Blood, UA: NEGATIVE
GLUCOSE UA: NEGATIVE
KETONES UA: NEGATIVE
NITRITE UA: NEGATIVE
PH UA: 7 (ref 5.0–8.0)
Protein, UA: NEGATIVE
Spec Grav, UA: 1.01 (ref 1.010–1.025)
Urobilinogen, UA: 0.2 E.U./dL

## 2016-12-15 NOTE — Progress Notes (Signed)
[name]-Pt "exhausted" sleeping approximately 18 hours a day. Reports increased vaginal discharge and constant leakage of fluid. Fern negative, pooling negative, and Nitrazine negative. Note given to work from home. Work note given to patient's mother, Conley RollsLillian Kinlaw, to care for patient. Discussed discomforts of pregnancy and home management measures. Reviewed red flag symptoms and when to call. RTC x 1 week for [name] or sooner if needed.

## 2016-12-15 NOTE — Patient Instructions (Signed)
Vaginal Delivery Vaginal delivery means that you will give birth by pushing your baby out of your birth canal (vagina). A team of health care providers will help you before, during, and after vaginal delivery. Birth experiences are unique for every woman and every pregnancy, and birth experiences vary depending on where you choose to give birth. What should I do to prepare for my baby's birth? Before your baby is born, it is important to talk with your health care provider about:  Your labor and delivery preferences. These may include: ? Medicines that you may be given. ? How you will manage your pain. This might include non-medical pain relief techniques or injectable pain relief such as epidural analgesia. ? How you and your baby will be monitored during labor and delivery. ? Who may be in the labor and delivery room with you. ? Your feelings about surgical delivery of your baby (cesarean delivery, or C-section) if this becomes necessary. ? Your feelings about receiving donated blood through an IV tube (blood transfusion) if this becomes necessary.  Whether you are able: ? To take pictures or videos of the birth. ? To eat during labor and delivery. ? To move around, walk, or change positions during labor and delivery.  What to expect after your baby is born, such as: ? Whether delayed umbilical cord clamping and cutting is offered. ? Who will care for your baby right after birth. ? Medicines or tests that may be recommended for your baby. ? Whether breastfeeding is supported in your hospital or birth center. ? How long you will be in the hospital or birth center.  How any medical conditions you have may affect your baby or your labor and delivery experience.  To prepare for your baby's birth, you should also:  Attend all of your health care visits before delivery (prenatal visits) as recommended by your health care provider. This is important.  Prepare your home for your baby's  arrival. Make sure that you have: ? Diapers. ? Baby clothing. ? Feeding equipment. ? Safe sleeping arrangements for you and your baby.  Install a car seat in your vehicle. Have your car seat checked by a certified car seat installer to make sure that it is installed safely.  Think about who will help you with your new baby at home for at least the first several weeks after delivery.  What can I expect when I arrive at the birth center or hospital? Once you are in labor and have been admitted into the hospital or birth center, your health care provider may:  Review your pregnancy history and any concerns you have.  Insert an IV tube into one of your veins. This is used to give you fluids and medicines.  Check your blood pressure, pulse, temperature, and heart rate (vital signs).  Check whether your bag of water (amniotic sac) has broken (ruptured).  Talk with you about your birth plan and discuss pain control options.  Monitoring Your health care provider may monitor your contractions (uterine monitoring) and your baby's heart rate (fetal monitoring). You may need to be monitored:  Often, but not continuously (intermittently).  All the time or for long periods at a time (continuously). Continuous monitoring may be needed if: ? You are taking certain medicines, such as medicine to relieve pain or make your contractions stronger. ? You have pregnancy or labor complications.  Monitoring may be done by:  Placing a special stethoscope or a handheld monitoring device on your abdomen to   check your baby's heartbeat, and feeling your abdomen for contractions. This method of monitoring does not continuously record your baby's heartbeat or your contractions.  Placing monitors on your abdomen (external monitors) to record your baby's heartbeat and the frequency and length of contractions. You may not have to wear external monitors all the time.  Placing monitors inside of your uterus  (internal monitors) to record your baby's heartbeat and the frequency, length, and strength of your contractions. ? Your health care provider may use internal monitors if he or she needs more information about the strength of your contractions or your baby's heart rate. ? Internal monitors are put in place by passing a thin, flexible wire through your vagina and into your uterus. Depending on the type of monitor, it may remain in your uterus or on your baby's head until birth. ? Your health care provider will discuss the benefits and risks of internal monitoring with you and will ask for your permission before inserting the monitors.  Telemetry. This is a type of continuous monitoring that can be done with external or internal monitors. Instead of having to stay in bed, you are able to move around during telemetry. Ask your health care provider if telemetry is an option for you.  Physical exam Your health care provider may perform a physical exam. This may include:  Checking whether your baby is positioned: ? With the head toward your vagina (head-down). This is most common. ? With the head toward the top of your uterus (head-up or breech). If your baby is in a breech position, your health care provider may try to turn your baby to a head-down position so you can deliver vaginally. If it does not seem that your baby can be born vaginally, your provider may recommend surgery to deliver your baby. In rare cases, you may be able to deliver vaginally if your baby is head-up (breech delivery). ? Lying sideways (transverse). Babies that are lying sideways cannot be delivered vaginally.  Checking your cervix to determine: ? Whether it is thinning out (effacing). ? Whether it is opening up (dilating). ? How low your baby has moved into your birth canal.  What are the three stages of labor and delivery?  Normal labor and delivery is divided into the following three stages: Stage 1  Stage 1 is the  longest stage of labor, and it can last for hours or days. Stage 1 includes: ? Early labor. This is when contractions may be irregular, or regular and mild. Generally, early labor contractions are more than 10 minutes apart. ? Active labor. This is when contractions get longer, more regular, more frequent, and more intense. ? The transition phase. This is when contractions happen very close together, are very intense, and may last longer than during any other part of labor.  Contractions generally feel mild, infrequent, and irregular at first. They get stronger, more frequent (about every 2-3 minutes), and more regular as you progress from early labor through active labor and transition.  Many women progress through stage 1 naturally, but you may need help to continue making progress. If this happens, your health care provider may talk with you about: ? Rupturing your amniotic sac if it has not ruptured yet. ? Giving you medicine to help make your contractions stronger and more frequent.  Stage 1 ends when your cervix is completely dilated to 4 inches (10 cm) and completely effaced. This happens at the end of the transition phase. Stage 2  Once   your cervix is completely effaced and dilated to 4 inches (10 cm), you may start to feel an urge to push. It is common for the body to naturally take a rest before feeling the urge to push, especially if you received an epidural or certain other pain medicines. This rest period may last for up to 1-2 hours, depending on your unique labor experience.  During stage 2, contractions are generally less painful, because pushing helps relieve contraction pain. Instead of contraction pain, you may feel stretching and burning pain, especially when the widest part of your baby's head passes through the vaginal opening (crowning).  Your health care provider will closely monitor your pushing progress and your baby's progress through the vagina during stage 2.  Your  health care provider may massage the area of skin between your vaginal opening and anus (perineum) or apply warm compresses to your perineum. This helps it stretch as the baby's head starts to crown, which can help prevent perineal tearing. ? In some cases, an incision may be made in your perineum (episiotomy) to allow the baby to pass through the vaginal opening. An episiotomy helps to make the opening of the vagina larger to allow more room for the baby to fit through.  It is very important to breathe and focus so your health care provider can control the delivery of your baby's head. Your health care provider may have you decrease the intensity of your pushing, to help prevent perineal tearing.  After delivery of your baby's head, the shoulders and the rest of the body generally deliver very quickly and without difficulty.  Once your baby is delivered, the umbilical cord may be cut right away, or this may be delayed for 1-2 minutes, depending on your baby's health. This may vary among health care providers, hospitals, and birth centers.  If you and your baby are healthy enough, your baby may be placed on your chest or abdomen to help maintain the baby's temperature and to help you bond with each other. Some mothers and babies start breastfeeding at this time. Your health care team will dry your baby and help keep your baby warm during this time.  Your baby may need immediate care if he or she: ? Showed signs of distress during labor. ? Has a medical condition. ? Was born too early (prematurely). ? Had a bowel movement before birth (meconium). ? Shows signs of difficulty transitioning from being inside the uterus to being outside of the uterus. If you are planning to breastfeed, your health care team will help you begin a feeding. Stage 3  The third stage of labor starts immediately after the birth of your baby and ends after you deliver the placenta. The placenta is an organ that develops  during pregnancy to provide oxygen and nutrients to your baby in the womb.  Delivering the placenta may require some pushing, and you may have mild contractions. Breastfeeding can stimulate contractions to help you deliver the placenta.  After the placenta is delivered, your uterus should tighten (contract) and become firm. This helps to stop bleeding in your uterus. To help your uterus contract and to control bleeding, your health care provider may: ? Give you medicine by injection, through an IV tube, by mouth, or through your rectum (rectally). ? Massage your abdomen or perform a vaginal exam to remove any blood clots that are left in your uterus. ? Empty your bladder by placing a thin, flexible tube (catheter) into your bladder. ? Encourage   you to breastfeed your baby. After labor is over, you and your baby will be monitored closely to ensure that you are both healthy until you are ready to go home. Your health care team will teach you how to care for yourself and your baby. This information is not intended to replace advice given to you by your health care provider. Make sure you discuss any questions you have with your health care provider. Document Released: 01/07/2008 Document Revised: 10/18/2015 Document Reviewed: 04/14/2015 Elsevier Interactive Patient Education  2018 [employer] Inc.  

## 2016-12-22 ENCOUNTER — Ambulatory Visit (INDEPENDENT_AMBULATORY_CARE_PROVIDER_SITE_OTHER): Payer: BLUE CROSS/BLUE SHIELD | Admitting: Obstetrics and Gynecology

## 2016-12-22 VITALS — BP 130/74 | HR 93 | Wt 213.7 lb

## 2016-12-22 DIAGNOSIS — Z3493 Encounter for supervision of normal pregnancy, unspecified, third trimester: Secondary | ICD-10-CM

## 2016-12-22 LAB — POCT URINALYSIS DIPSTICK
BILIRUBIN UA: NEGATIVE
Glucose, UA: NEGATIVE
Ketones, UA: NEGATIVE
Leukocytes, UA: NEGATIVE
NITRITE UA: NEGATIVE
PH UA: 6.5 (ref 5.0–8.0)
Protein, UA: NEGATIVE
RBC UA: NEGATIVE
SPEC GRAV UA: 1.01 (ref 1.010–1.025)
UROBILINOGEN UA: 0.2 U/dL

## 2016-12-22 NOTE — Progress Notes (Signed)
[name]- pt is having some contractions, lots of pelvic pressure 

## 2016-12-22 NOTE — Progress Notes (Signed)
[name]-labor precautions discussed.  doing well.

## 2016-12-23 ENCOUNTER — Observation Stay
Admission: EM | Admit: 2016-12-23 | Discharge: 2016-12-23 | Disposition: A | Discharge status: Home / Self Care | Payer: BLUE CROSS/BLUE SHIELD | Attending: Obstetrics and Gynecology | Admitting: Obstetrics and Gynecology

## 2016-12-23 ENCOUNTER — Encounter: Payer: Self-pay | Admitting: *Deleted

## 2016-12-23 DIAGNOSIS — Z3A00 Weeks of gestation of pregnancy not specified: Secondary | ICD-10-CM | POA: Insufficient documentation

## 2016-12-23 DIAGNOSIS — R109 Unspecified abdominal pain: Secondary | ICD-10-CM | POA: Diagnosis present

## 2016-12-23 NOTE — Discharge Instructions (Signed)
Drink plenty of fluid and get plenty of rest. Call your provider for any other concerns °

## 2016-12-23 NOTE — OB Triage Note (Signed)
Patient states she started having contractions at 0900. Within past 2 hours they have been 4 min apart 6/10 pain scale. Pt states baby is moving well. denis any lof or vaginal bleeding.

## 2016-12-23 NOTE — Discharge Summary (Signed)
Patient discharged home, discharge instructions given, patient states understanding. Patient left floor in stable condition, denies any other needs at this time. Patient to keep next scheduled OB appointment 

## 2016-12-24 ENCOUNTER — Ambulatory Visit (INDEPENDENT_AMBULATORY_CARE_PROVIDER_SITE_OTHER): Payer: BLUE CROSS/BLUE SHIELD | Admitting: Certified Nurse Midwife

## 2016-12-24 ENCOUNTER — Telehealth: Payer: Self-pay | Admitting: Obstetrics and Gynecology

## 2016-12-24 VITALS — BP 133/85 | HR 81 | Wt 214.6 lb

## 2016-12-24 DIAGNOSIS — Z3403 Encounter for supervision of normal first pregnancy, third trimester: Secondary | ICD-10-CM

## 2016-12-24 LAB — POCT URINALYSIS DIPSTICK
BILIRUBIN UA: NEGATIVE
Glucose, UA: NEGATIVE
KETONES UA: NEGATIVE
Nitrite, UA: NEGATIVE
Protein, UA: NEGATIVE
Urobilinogen, UA: 0.2 E.U./dL
pH, UA: 7.5 (ref 5.0–8.0)

## 2016-12-24 NOTE — Patient Instructions (Signed)
Elective Birth Introduction Early elective birth refers to making a choice to have a baby before the time the baby is due. The length of a pregnancy is 9 months, or 40 weeks, starting from the beginning of a woman's last menstrual period. Most women naturally go into labor around 40 weeks of gestation. A full-term pregnancy is considered between 37 weeks and 42 weeks of gestation. Currently, early elective births can take place sometime after 39 weeks of gestation. Most health care providers practice within the guidelines of delivering a baby no later than 42 weeks of gestation and no earlier than 39 weeks of gestation. There are exceptions to this time interval, and the risks involved to the mother and baby need to be considered in those cases. Induction of labor refers to the use of medicines to bring aboutcontractions. Labor is when the cervix starts to widen (dilate). Active labor is when there are contractions and the cervix has dilated to at least 4 cm. Oftentimes, the earlier a mother is in her pregnancy, the longer it takes to get induced. When the cervix is ready (dilated and soft), an induction may take less than a day. However, when a cervix is far away from being ready (long, closed, and firm), it may take days in a hospital for labor to start. Currently, 39 weeks of gestation is considered the earliest a health care providershould start the induction process. This is because the longer the baby stays inside the uterus, the lower the risks are to both the baby and mother. However, sometimes there are very good reasons for a pregnancy to be induced before 39 weeks of gestation. These exceptions are specific to each individual pregnancy and need to be considered on a case-by-case basis. A good reason to induce one pregnancy may not be a good reason to induce another another pregnancy. REASONS FOR ELECTIVE BIRTH It may be safer to induce labor before 39 weeks of gestation if:  A woman is carrying  more than 1 baby. Current standards are to deliver twin pregnancies at 38 weeks of gestation.  A woman is having complications, such as: ? High blood pressure caused by pregnancy (preeclampsia). ? Bleeding. ? Infection.  There are conditions affecting the baby's health, such as: ? Intrauterine growth restriction (IUGR), where the baby is not growing well. ? Having abnormal fetal heart rate patterns on the monitor (nonreassuring tracing). ? Having a lack of fluid that surrounds the baby (oligohydramnios). ? Having issues with the placenta.  Fluid that surrounds the baby (amniotic fluid) is leaking.  There are many other safety reasons that a pregnancy may need to be induced early. REASONS AGAINST ELECTIVE BIRTH Sometimes early elective birth is not the best choice. It may not be a good idea if:  An early birth is chosen just for convenience.  You want the baby to be born on a certain date, like a holiday.  You are more likely to need a cesarean delivery before 39 weeks of gestation.  A cesarean delivery can lead to other problems. Problems include infection, bleeding, and not having enough iron in your blood (anemia), which can cause weakness. Babies born early (34-37 weeks of gestation):  May need special care at the hospital or in a special care nursery.  Are at a greater risk for: ? Infection. ? Brain damage or bleeding inside the brain. ? Dying during their first year of life. ? Feeding problems. ? Breathing problems. ? Slow physical and mental development.  May need  special care in a neonatal intensive care unit (NICU), but this is rare. The length of the baby's stay in the hospital will depend on how quickly he or she progresses to a safe level of care.  REDUCING EARLY ELECTIVE BIRTHS Carrying a baby longer than 42 weeks of gestation is not good for the baby or the mother. A full-term pregnancy is best for baby and mother. Anything earlier can be risky for you and your  baby. Remember:  An early elective birth may lead to a cesarean delivery. This can lead to other problems for the mother and baby.  An early elective birth can result in developmental problems for your child.  A baby's brain continues to develop while in the uterus.  A baby's body continues to develop. The baby will be better able to breathe and eat when he or she is born near the due date.  A baby who stays in the uterus longer responds better. The baby will also bond better with you.  This information is not intended to replace advice given to you by your health care provider. Make sure you discuss any questions you have with your health care provider. Document Released: 12/10/2010 Document Revised: 09/05/2015 Document Reviewed: 10/27/2012 Elsevier Interactive Patient Education  2017 [employer]

## 2016-12-24 NOTE — Telephone Encounter (Signed)
Patient called and stated that she did not have the baby, and was told to call the office, Patient would like a call back to speak with Amy or Melody. Please advise.

## 2016-12-24 NOTE — Telephone Encounter (Signed)
pls advise

## 2016-12-24 NOTE — Telephone Encounter (Signed)
Janet Rivera, please call patient. She may come in to office for cervical exam by Pattricia BossAnnie before lunch today. If not in labor, then Pattricia Bossnnie will try and schedule an induction of next week. All induction spots are booked and we are preparing for an influx of women due to a change in the barometric pressure.

## 2016-12-24 NOTE — Progress Notes (Signed)
[name], doing well . Pt came into day for SVE due to contractions Q 5 minutes. SVE no change from previous exam . She endorses good fetal movement. Discussed option for elective induction. Pt she would like to schedule. Information faxed to L&D , induction scheduled for 9/17 @ 8pm. Labor precautions reviewed. Follow up as scheduled for induction.   Doreene BurkeAnnie Jules Baty, CNM

## 2016-12-25 ENCOUNTER — Inpatient Hospital Stay: Payer: BLUE CROSS/BLUE SHIELD | Admitting: Anesthesiology

## 2016-12-25 ENCOUNTER — Inpatient Hospital Stay
Admission: EM | Admit: 2016-12-25 | Discharge: 2016-12-27 | DRG: 775 | Disposition: A | Discharge status: Home / Self Care | Payer: BLUE CROSS/BLUE SHIELD | Attending: Certified Nurse Midwife | Admitting: Certified Nurse Midwife

## 2016-12-25 DIAGNOSIS — O9962 Diseases of the digestive system complicating childbirth: Secondary | ICD-10-CM | POA: Diagnosis present

## 2016-12-25 DIAGNOSIS — F419 Anxiety disorder, unspecified: Secondary | ICD-10-CM | POA: Diagnosis present

## 2016-12-25 DIAGNOSIS — Z3A39 39 weeks gestation of pregnancy: Secondary | ICD-10-CM | POA: Diagnosis not present

## 2016-12-25 DIAGNOSIS — O326XX0 Maternal care for compound presentation, not applicable or unspecified: Secondary | ICD-10-CM | POA: Diagnosis present

## 2016-12-25 DIAGNOSIS — O479 False labor, unspecified: Secondary | ICD-10-CM

## 2016-12-25 DIAGNOSIS — O99344 Other mental disorders complicating childbirth: Secondary | ICD-10-CM | POA: Diagnosis present

## 2016-12-25 DIAGNOSIS — Z3493 Encounter for supervision of normal pregnancy, unspecified, third trimester: Secondary | ICD-10-CM | POA: Diagnosis present

## 2016-12-25 DIAGNOSIS — D649 Anemia, unspecified: Secondary | ICD-10-CM | POA: Diagnosis present

## 2016-12-25 DIAGNOSIS — K219 Gastro-esophageal reflux disease without esophagitis: Secondary | ICD-10-CM | POA: Diagnosis present

## 2016-12-25 DIAGNOSIS — O9902 Anemia complicating childbirth: Secondary | ICD-10-CM | POA: Diagnosis present

## 2016-12-25 LAB — CBC
HCT: 31.2 % — ABNORMAL LOW (ref 35.0–47.0)
Hemoglobin: 10.6 g/dL — ABNORMAL LOW (ref 12.0–16.0)
MCH: 27.1 pg (ref 26.0–34.0)
MCHC: 33.8 g/dL (ref 32.0–36.0)
MCV: 80.2 fL (ref 80.0–100.0)
PLATELETS: 288 10*3/uL (ref 150–440)
RBC: 3.9 MIL/uL (ref 3.80–5.20)
RDW: 16.6 % — AB (ref 11.5–14.5)
WBC: 10.2 10*3/uL (ref 3.6–11.0)

## 2016-12-25 LAB — TYPE AND SCREEN
ABO/RH(D): O POS
Antibody Screen: NEGATIVE

## 2016-12-25 MED ORDER — LACTATED RINGERS IV SOLN: 500.0000 mL | INTRAVENOUS | Status: DC | PRN

## 2016-12-25 MED ORDER — PHENYLEPHRINE 40 MCG/ML (10ML) SYRINGE FOR IV PUSH (FOR BLOOD PRESSURE SUPPORT): 80.0000 ug | PREFILLED_SYRINGE | INTRAVENOUS | Status: DC | PRN

## 2016-12-25 MED ORDER — LACTATED RINGERS IV SOLN
500.0000 mL | Freq: Once | INTRAVENOUS | Status: AC
  Administered 2016-12-25: 21:00:00 via INTRAVENOUS

## 2016-12-25 MED ORDER — OXYTOCIN 40 UNITS IN LACTATED RINGERS INFUSION - SIMPLE MED: 2.5000 [IU]/h | INTRAVENOUS | Status: DC

## 2016-12-25 MED ORDER — FENTANYL 2.5 MCG/ML W/ROPIVACAINE 0.15% IN NS 100 ML EPIDURAL (ARMC)
12.0000 mL/h | EPIDURAL | Status: DC
  Administered 2016-12-25: 10 mL/h via EPIDURAL
  Filled 2016-12-25: qty 100

## 2016-12-25 MED ORDER — OXYTOCIN BOLUS FROM INFUSION: 500.0000 mL | Freq: Once | INTRAVENOUS | Status: DC

## 2016-12-25 MED ORDER — BUTORPHANOL TARTRATE 2 MG/ML IJ SOLN: 1.0000 mg | INTRAMUSCULAR | Status: DC | PRN

## 2016-12-25 MED ORDER — TERBUTALINE SULFATE 1 MG/ML IJ SOLN: 0.2500 mg | Freq: Once | INTRAMUSCULAR | Status: DC | PRN

## 2016-12-25 MED ORDER — BUPIVACAINE HCL (PF) 0.25 % IJ SOLN
INTRAMUSCULAR | Status: DC | PRN
  Administered 2016-12-25: 5 mL via EPIDURAL

## 2016-12-25 MED ORDER — EPHEDRINE 5 MG/ML INJ: 10.0000 mg | INTRAVENOUS | Status: DC | PRN

## 2016-12-25 MED ORDER — LACTATED RINGERS IV SOLN
INTRAVENOUS | Status: DC
  Administered 2016-12-25: 20:00:00 via INTRAVENOUS

## 2016-12-25 MED ORDER — SOD CITRATE-CITRIC ACID 500-334 MG/5ML PO SOLN
30.0000 mL | ORAL | Status: DC | PRN
  Filled 2016-12-25: qty 15

## 2016-12-25 MED ORDER — VENLAFAXINE HCL ER 75 MG PO CP24
75.0000 mg | ORAL_CAPSULE | Freq: Every day | ORAL | Status: DC
  Administered 2016-12-26: 75 mg via ORAL
  Filled 2016-12-25: qty 1

## 2016-12-25 MED ORDER — FENTANYL 2.5 MCG/ML W/ROPIVACAINE 0.15% IN NS 100 ML EPIDURAL (ARMC)
EPIDURAL | Status: AC
  Filled 2016-12-25: qty 100

## 2016-12-25 MED ORDER — LIDOCAINE HCL (PF) 1 % IJ SOLN: 30.0000 mL | INTRAMUSCULAR | Status: DC | PRN

## 2016-12-25 MED ORDER — PROMETHAZINE HCL 25 MG/ML IJ SOLN
12.5000 mg | Freq: Once | INTRAMUSCULAR | Status: AC
  Administered 2016-12-25: 12.5 mg via INTRAMUSCULAR
  Filled 2016-12-25: qty 1

## 2016-12-25 MED ORDER — ONDANSETRON HCL 4 MG/2ML IJ SOLN: 4.0000 mg | Freq: Four times a day (QID) | INTRAMUSCULAR | Status: DC | PRN

## 2016-12-25 MED ORDER — MORPHINE SULFATE (PF) 4 MG/ML IV SOLN
8.0000 mg | Freq: Once | INTRAVENOUS | Status: AC
  Administered 2016-12-25: 8 mg via INTRAMUSCULAR
  Filled 2016-12-25: qty 2

## 2016-12-25 MED ORDER — DIPHENHYDRAMINE HCL 50 MG/ML IJ SOLN: 12.5000 mg | INTRAMUSCULAR | Status: DC | PRN

## 2016-12-25 MED ORDER — OXYTOCIN 40 UNITS IN LACTATED RINGERS INFUSION - SIMPLE MED
1.0000 m[IU]/min | INTRAVENOUS | Status: DC
  Administered 2016-12-25: 2 m[IU]/min via INTRAVENOUS
  Filled 2016-12-25 (×2): qty 1000

## 2016-12-25 MED ORDER — ACETAMINOPHEN 325 MG PO TABS: 650.0000 mg | ORAL_TABLET | ORAL | Status: DC | PRN

## 2016-12-25 NOTE — Progress Notes (Signed)
Janet Rivera is a 26 y.o. G1P0000 at [redacted]w[redacted]d by LMP admitted for rupture of membranes  Subjective:  Patient reports right side lower abdominal pain and pelvic pressure. FOB at bedside.   Denies difficulty breathing or respiratory distress, chest pain, dysuria, and leg pain or swelling.   Objective:  Temp:  [98.2 F (36.8 C)-99.1 F (37.3 C)] 99.1 F (37.3 C) (09/14 2200) Pulse Rate:  [91-98] 95 (09/14 2200) Resp:  [16-18] 18 (09/14 2200) BP: (131-139)/(72-74) 139/72 (09/14 1935) Weight:  [208 lb (94.3 kg)] 208 lb (94.3 kg) (09/14 1935)  FHT:  FHR: 145 bpm, variability: moderate,  accelerations:  Present,  decelerations:  Absent  UC:   regular, every two (2) to three (3) minutes, soft resting tone, pitocin 6 mu/min  SVE:   Dilation: Lip/rim Effacement (%): 100 Station: -3 Exam by:: Willodean Rosenthal CNM   Labs: Lab Results  Component Value Date   WBC 10.2 12/25/2016   HGB 10.6 (L) 12/25/2016   HCT 31.2 (L) 12/25/2016   MCV 80.2 12/25/2016   PLT 288 12/25/2016    Assessment:  Janet Rivera is a 26 y.o. G1P0000 at [redacted]w[redacted]d being admitted for spontaneous rupture of membranes at term, pitocin augmentation, Rh positive, GBS negative  FHR Category I  Plan:  Room prepared for second stage.   Continue orders as written. Reassess as needed.   Gunnar Bulla, CNM 12/25/2016, 11:08 PM

## 2016-12-25 NOTE — Anesthesia Preprocedure Evaluation (Signed)
Anesthesia Evaluation  Patient identified by MRN, date of birth, ID band Patient awake    Reviewed: Allergy & Precautions, NPO status , Patient's Chart, lab work & pertinent test results, reviewed documented beta blocker date and time   Airway Mallampati: III  TM Distance: >3 FB     Dental  (+) Chipped   Pulmonary           Cardiovascular      Neuro/Psych PSYCHIATRIC DISORDERS Anxiety Depression    GI/Hepatic PUD, GERD  Controlled,  Endo/Other    Renal/GU      Musculoskeletal   Abdominal   Peds  Hematology   Anesthesia Other Findings Obese.  Reproductive/Obstetrics                             Anesthesia Physical Anesthesia Plan  ASA: II  Anesthesia Plan: Epidural   Post-op Pain Management:    Induction:   PONV Risk Score and Plan:   Airway Management Planned:   Additional Equipment:   Intra-op Plan:   Post-operative Plan:   Informed Consent: I have reviewed the patients History and Physical, chart, labs and discussed the procedure including the risks, benefits and alternatives for the proposed anesthesia with the patient or authorized representative who has indicated his/her understanding and acceptance.     Plan Discussed with: CRNA  Anesthesia Plan Comments:         Anesthesia Quick Evaluation

## 2016-12-25 NOTE — OB Triage Note (Signed)
Ms. Janet Rivera here with c/o ctx and DFM. Denies LOF, reports some "bloody mucous".

## 2016-12-25 NOTE — Discharge Summary (Signed)
Physician Obstetric Discharge Summary  Patient ID: Montrice Montuori MRN: [id number] DOB/AGE: 1990/11/04 26 y.o.   Date of Admission: 12/23/2016  Date of Discharge: 12/23/2016  Admitting Diagnosis: Observation at [redacted]w[redacted]d  Secondary Diagnosis: Anemia in pregnancy and Anxiety/Depression  Antepartum Procedures: NST    Brief Hospital Course   L&D OB Triage Note  Cleotha Whalin is a 26 y.o. G41P0000 female at [redacted]w[redacted]d, EDD Estimated Date of Delivery: 12/29/16 who presented to triage for complaints of regular uterine contractions for the last two (2) days.  She was evaluated by the nurses with no significant findings for active labor. Vital signs stable. An NST was performed and has been reviewed by CNM.   NST INTERPRETATION: Indications: rule out uterine contractions  Mode: External Baseline Rate (A): 140 bpm Variability: Moderate Accelerations: 15 x 15 Decelerations: None     Contraction Frequency (min): irregular  Impression: reactive  Plan: NST performed was reviewed and was found to be reactive. She was discharged home with bleeding/labor precautions.  Continue routine prenatal care. Follow up with CNM as previously scheduled.   Discharge Instructions: Per After Visit Summary.  Activity: Refer to After Visit Summary  Diet: Regular  Medications:  Allergies as of 12/23/2016   No Known Allergies     Medication List    ASK your doctor about these medications   pantoprazole 40 MG tablet Commonly known as:  PROTONIX Take by mouth.   prenatal multivitamin Tabs tablet Take 1 tablet by mouth daily at 12 noon.   ranitidine 150 MG tablet Commonly known as:  ZANTAC Take 1 tablet (150 mg total) by mouth 2 (two) times daily.   venlafaxine XR 75 MG 24 hr capsule Commonly known as:  EFFEXOR XR Take 1 capsule (75 mg total) by mouth daily with breakfast.      Outpatient follow up:  Follow-up Information    [facility] Follow up on 12/29/2016.   Contact  information: [address]  [address] [place] [address] [phone]         Postpartum contraception: oral contraceptives (estrogen/progesterone), Depo-Provera  Discharged Condition: stable  Discharged to: home   Gunnar Bulla, [place]

## 2016-12-25 NOTE — H&P (Signed)
Obstetric History and Physical  Janet Rivera is a 26 y.o. G1P0000 with IUP at [redacted]w[redacted]d presenting with spontaneous rupture of membrane at term. Patient states she has been having  irregular, every three (3) to seven (7) minutes contractions, none vaginal bleeding with active fetal movement.    Denies difficulty breathing or respiratory distress, chest pain, dysuria, and leg pain.   Prenatal Course  Source of Care: [facility], initial visit at 11 wks, total visits: 16  Pregnancy complications or risks: Anxiety taking Effexor 75 mg, anemia in pregnancy  Prenatal labs and studies:  ABO, Rh: --/--/PENDING 2022-12-27 1945)  Antibody: PENDING 12-27-22 1945)  Rubella: 1.73 (02/05 1444)  Varicella: 1417 (02/05 1444)  RPR: Non Reactive (02/05 1444)   HBsAg: Negative (02/05 1444)   HIV: Non Reactive (02/05 1444)  ZOX:[id number] (08/23 1218)  1 hr Glucola: 138 (06/26 0806)  Genetic screening:  Normal (03/05 0000)  Anatomy US:  Normal (05/01 0801)  Past Medical History:  Diagnosis Date  . Acid reflux   . Anxiety   . Depression   . Nausea/vomiting in pregnancy     Past Surgical History:  Procedure Laterality Date  . NO PAST SURGERIES      OB History  Gravida Para Term Preterm AB Living  1 0 0 0 0 0  SAB TAB Ectopic Multiple Live Births  0 0 0 0 0    # Outcome Date GA Lbr Len/2nd Weight Sex Delivery Anes PTL Lv  1 Current               Social History   Social History  . Marital status: Married    Spouse name: N/A  . Number of children: N/A  . Years of education: N/A   Social History Main Topics  . Smoking status: Never Smoker  . Smokeless tobacco: Never Used  . Alcohol use No  . Drug use: No  . Sexual activity: Yes    Birth control/ protection: None   Other Topics Concern  . None   Social History Narrative  . None    Family History  Problem Relation Age of Onset  . Thyroid disease Mother   . Rheum arthritis Father   . Diabetes Father   . Diabetes Maternal  Grandmother   . Breast cancer Paternal Grandmother   . Rheum arthritis Paternal Grandfather     Prescriptions Prior to Admission  Medication Sig Dispense Refill Last Dose  . pantoprazole (PROTONIX) 40 MG tablet Take by mouth.   Taking  . Prenatal Vit-Fe Fumarate-FA (PRENATAL MULTIVITAMIN) TABS tablet Take 1 tablet by mouth daily at 12 noon.   Taking  . ranitidine (ZANTAC) 150 MG tablet Take 1 tablet (150 mg total) by mouth 2 (two) times daily. 60 tablet 4 Taking  . venlafaxine XR (EFFEXOR XR) 75 MG 24 hr capsule Take 1 capsule (75 mg total) by mouth daily with breakfast. 30 capsule 0 Taking    No Known Allergies  Review of Systems: Negative except for what is mentioned in HPI.  Physical Exam:  Temp:  [98.2 F (36.8 C)-98.3 F (36.8 C)] 98.2 F (36.8 C) 27-Dec-2022 1935) Pulse Rate:  [91-98] 98 2022/12/27 1935) Resp:  [16-18] 18 12/27/2022 1935) BP: (131-139)/(72-74) 139/72 12-27-22 1935) Weight:  [208 lb (94.3 kg)] 208 lb (94.3 kg) 12/27/2022 1935)  GENERAL: Well-developed, well-nourished female in no acute distress.   LUNGS: Clear to auscultation bilaterally.   HEART: Regular rate and rhythm.  ABDOMEN: Soft, nontender, nondistended, gravid.  EXTREMITIES: Nontender, no  edema, 2+ distal pulses.  Cervical Exam: Dilation: 3 Effacement (%): 60 Cervical Position: Posterior Station: -3 Presentation: Vertex Exam by:: k. yates, rn  FHT:  Baseline rate 135 bpm   Variability moderate  Accelerations present   Decelerations none  Contractions: Every three (3) to four (4) minutes, soft resting tone, pitocin at 2 mu/min   Pertinent Labs/Studies:    Results for orders placed or performed during the hospital encounter of 12/25/16 (from the past 24 hour(s))  CBC     Status: Abnormal   Collection Time: 12/25/16  7:45 PM  Result Value Ref Range   WBC 10.2 3.6 - 11.0 K/uL   RBC 3.90 3.80 - 5.20 MIL/uL   Hemoglobin 10.6 (L) 12.0 - 16.0 g/dL   HCT 16.1 (L) 09.6 - 04.5 %   MCV 80.2 80.0 - 100.0 fL    MCH 27.1 26.0 - 34.0 pg   MCHC 33.8 32.0 - 36.0 g/dL   RDW 40.9 (H) 81.1 - 91.4 %   Platelets 288 150 - 440 K/uL  Type and screen [facility]     Status: None (Preliminary result)   Collection Time: 12/25/16  7:45 PM  Result Value Ref Range   ABO/RH(D) PENDING    Antibody Screen PENDING    Sample Expiration 12/28/2016     Assessment :  Janet Rivera is a 26 y.o. G1P0000 at [redacted]w[redacted]d being admitted for spontaneous rupture of membranes at term, pitocin augmentation, Rh positive, GBS negative  FHR Category I  Plan:  Admit to birthing suites.  Patient desires epidural placement prior to pitocin augmentation. We reassess one comfortable.   Reviewed red flag symptoms and when to call.   Gunnar Bulla, CNM [facility], [facility]

## 2016-12-25 NOTE — Anesthesia Procedure Notes (Signed)
Epidural Patient location during procedure: OB  Staffing Anesthesiologist: Meosha Castanon Performed: anesthesiologist   Preanesthetic Checklist Completed: patient identified, site marked, surgical consent, pre-op evaluation, timeout performed, IV checked, risks and benefits discussed and monitors and equipment checked  Epidural Patient position: sitting Prep: Betadine Patient monitoring: heart rate, continuous pulse ox and blood pressure Approach: midline Location: L4-L5 Injection technique: LOR saline  Needle:  Needle type: Tuohy  Needle gauge: 18 G Needle length: 9 cm and 9 Catheter type: closed end flexible Catheter size: 20 Guage Test dose: negative and 1.5% lidocaine with Epi 1:200 K  Assessment Sensory level: T10 Events: blood not aspirated, injection not painful, no injection resistance, negative IV test and no paresthesia  Additional Notes   Patient tolerated the insertion well without complications.Reason for block:procedure for pain     

## 2016-12-26 DIAGNOSIS — Z3A39 39 weeks gestation of pregnancy: Secondary | ICD-10-CM

## 2016-12-26 DIAGNOSIS — O99344 Other mental disorders complicating childbirth: Principal | ICD-10-CM

## 2016-12-26 LAB — CBC
HEMATOCRIT: 26.1 % — AB (ref 35.0–47.0)
Hemoglobin: 8.7 g/dL — ABNORMAL LOW (ref 12.0–16.0)
MCH: 26.7 pg (ref 26.0–34.0)
MCHC: 33.2 g/dL (ref 32.0–36.0)
MCV: 80.2 fL (ref 80.0–100.0)
PLATELETS: 225 10*3/uL (ref 150–440)
RBC: 3.25 MIL/uL — ABNORMAL LOW (ref 3.80–5.20)
RDW: 17.1 % — AB (ref 11.5–14.5)
WBC: 19.5 10*3/uL — AB (ref 3.6–11.0)

## 2016-12-26 LAB — COMPREHENSIVE METABOLIC PANEL
ALT: 13 U/L — ABNORMAL LOW (ref 14–54)
ANION GAP: 4 — AB (ref 5–15)
AST: 27 U/L (ref 15–41)
Albumin: 2.2 g/dL — ABNORMAL LOW (ref 3.5–5.0)
Alkaline Phosphatase: 146 U/L — ABNORMAL HIGH (ref 38–126)
BILIRUBIN TOTAL: 0.5 mg/dL (ref 0.3–1.2)
BUN: 9 mg/dL (ref 6–20)
CHLORIDE: 110 mmol/L (ref 101–111)
CO2: 22 mmol/L (ref 22–32)
CREATININE: 0.61 mg/dL (ref 0.44–1.00)
Calcium: 8.6 mg/dL — ABNORMAL LOW (ref 8.9–10.3)
GFR calc Af Amer: >60 mL/min (ref >60)
GLUCOSE: 93 mg/dL (ref 65–99)
POTASSIUM: 4 mmol/L (ref 3.5–5.1)
Sodium: 136 mmol/L (ref 135–145)
Total Protein: 5.1 g/dL — ABNORMAL LOW (ref 6.5–8.1)

## 2016-12-26 MED ORDER — SENNOSIDES-DOCUSATE SODIUM 8.6-50 MG PO TABS
2.0000 | ORAL_TABLET | ORAL | Status: DC
  Administered 2016-12-26: 2 via ORAL
  Filled 2016-12-26: qty 2

## 2016-12-26 MED ORDER — TETANUS-DIPHTH-ACELL PERTUSSIS 5-2.5-18.5 LF-MCG/0.5 IM SUSP: 0.5000 mL | Freq: Once | INTRAMUSCULAR | Status: DC

## 2016-12-26 MED ORDER — INFLUENZA VAC SPLIT QUAD 0.5 ML IM SUSY
0.5000 mL | PREFILLED_SYRINGE | INTRAMUSCULAR | Status: AC
  Administered 2016-12-27: 0.5 mL via INTRAMUSCULAR
  Filled 2016-12-26: qty 0.5

## 2016-12-26 MED ORDER — HYDROCORTISONE 1 % EX CREA
TOPICAL_CREAM | Freq: Four times a day (QID) | CUTANEOUS | Status: DC
  Administered 2016-12-26 – 2016-12-27 (×4): via TOPICAL
  Filled 2016-12-26 (×2): qty 28

## 2016-12-26 MED ORDER — WITCH HAZEL-GLYCERIN EX PADS
1.0000 "application " | MEDICATED_PAD | CUTANEOUS | Status: DC | PRN
  Filled 2016-12-26: qty 100

## 2016-12-26 MED ORDER — ACETAMINOPHEN 325 MG PO TABS
650.0000 mg | ORAL_TABLET | ORAL | Status: DC | PRN
  Administered 2016-12-26 – 2016-12-27 (×2): 650 mg via ORAL
  Filled 2016-12-26 (×2): qty 2

## 2016-12-26 MED ORDER — VENLAFAXINE HCL ER 150 MG PO CP24
150.0000 mg | ORAL_CAPSULE | Freq: Every day | ORAL | Status: DC
  Administered 2016-12-27: 150 mg via ORAL
  Filled 2016-12-26: qty 1

## 2016-12-26 MED ORDER — ZOLPIDEM TARTRATE 5 MG PO TABS: 5.0000 mg | ORAL_TABLET | Freq: Every evening | ORAL | Status: DC | PRN

## 2016-12-26 MED ORDER — BENZOCAINE-MENTHOL 20-0.5 % EX AERO
INHALATION_SPRAY | CUTANEOUS | Status: AC
  Administered 2016-12-26: 1 via TOPICAL
  Filled 2016-12-26: qty 56

## 2016-12-26 MED ORDER — ONDANSETRON HCL 4 MG PO TABS: 4.0000 mg | ORAL_TABLET | ORAL | Status: DC | PRN

## 2016-12-26 MED ORDER — IBUPROFEN 600 MG PO TABS
ORAL_TABLET | ORAL | Status: AC
  Administered 2016-12-26: 600 mg via ORAL
  Filled 2016-12-26: qty 1

## 2016-12-26 MED ORDER — IBUPROFEN 600 MG PO TABS
600.0000 mg | ORAL_TABLET | Freq: Four times a day (QID) | ORAL | Status: DC
  Administered 2016-12-26 (×2): 600 mg via ORAL
  Filled 2016-12-26 (×2): qty 1

## 2016-12-26 MED ORDER — DIPHENHYDRAMINE HCL 25 MG PO CAPS: 25.0000 mg | ORAL_CAPSULE | Freq: Four times a day (QID) | ORAL | Status: DC | PRN

## 2016-12-26 MED ORDER — COCONUT OIL OIL
1.0000 "application " | TOPICAL_OIL | Status: DC | PRN
  Filled 2016-12-26: qty 120

## 2016-12-26 MED ORDER — BENZOCAINE-MENTHOL 20-0.5 % EX AERO
1.0000 "application " | INHALATION_SPRAY | CUTANEOUS | Status: DC | PRN
  Administered 2016-12-26: 1 via TOPICAL

## 2016-12-26 MED ORDER — SIMETHICONE 80 MG PO CHEW: 80.0000 mg | CHEWABLE_TABLET | ORAL | Status: DC | PRN

## 2016-12-26 MED ORDER — IBUPROFEN 600 MG PO TABS
600.0000 mg | ORAL_TABLET | Freq: Four times a day (QID) | ORAL | Status: DC
  Administered 2016-12-26 – 2016-12-27 (×4): 600 mg via ORAL
  Filled 2016-12-26 (×4): qty 1

## 2016-12-26 MED ORDER — ONDANSETRON HCL 4 MG/2ML IJ SOLN: 4.0000 mg | INTRAMUSCULAR | Status: DC | PRN

## 2016-12-26 MED ORDER — PRENATAL MULTIVITAMIN CH
1.0000 | ORAL_TABLET | Freq: Every day | ORAL | Status: DC
  Administered 2016-12-26 – 2016-12-27 (×2): 1 via ORAL
  Filled 2016-12-26 (×2): qty 1

## 2016-12-26 MED ORDER — OXYCODONE HCL 5 MG PO TABS
5.0000 mg | ORAL_TABLET | Freq: Once | ORAL | Status: AC
  Administered 2016-12-26: 5 mg via ORAL
  Filled 2016-12-26: qty 1

## 2016-12-26 MED ORDER — DIBUCAINE 1 % RE OINT: 1.0000 "application " | TOPICAL_OINTMENT | RECTAL | Status: DC | PRN

## 2016-12-26 NOTE — Progress Notes (Signed)
Pt up to bathroom, pt had BM, pt up from bathroom to bed, pt states has chest pain, VS obtained, no SHOB, no distress noted. CNM notified, EKG ordered.

## 2016-12-27 ENCOUNTER — Other Ambulatory Visit: Payer: Self-pay | Admitting: Certified Nurse Midwife

## 2016-12-27 ENCOUNTER — Encounter: Payer: Self-pay | Admitting: *Deleted

## 2016-12-27 LAB — CBC
HEMATOCRIT: 24.3 % — AB (ref 35.0–47.0)
Hemoglobin: 8.1 g/dL — ABNORMAL LOW (ref 12.0–16.0)
MCH: 26.9 pg (ref 26.0–34.0)
MCHC: 33.3 g/dL (ref 32.0–36.0)
MCV: 80.8 fL (ref 80.0–100.0)
PLATELETS: 238 10*3/uL (ref 150–440)
RBC: 3.01 MIL/uL — AB (ref 3.80–5.20)
RDW: 17.1 % — AB (ref 11.5–14.5)
WBC: 18.4 10*3/uL — AB (ref 3.6–11.0)

## 2016-12-27 MED ORDER — HYDROCORTISONE 1 % EX CREA: TOPICAL_CREAM | Freq: Four times a day (QID) | CUTANEOUS | 0 refills | Status: DC

## 2016-12-27 MED ORDER — VENLAFAXINE HCL ER 150 MG PO CP24: 150.0000 mg | ORAL_CAPSULE | Freq: Every day | ORAL | 1 refills | Status: DC

## 2016-12-27 MED ORDER — IBUPROFEN 600 MG PO TABS: 600.0000 mg | ORAL_TABLET | Freq: Four times a day (QID) | ORAL | 0 refills | Status: DC

## 2016-12-27 MED ORDER — FERROUS SULFATE 325 (65 FE) MG PO TABS
325.0000 mg | ORAL_TABLET | Freq: Three times a day (TID) | ORAL | Status: DC
  Administered 2016-12-27 (×2): 325 mg via ORAL
  Filled 2016-12-27 (×2): qty 1

## 2016-12-27 MED ORDER — NORETHINDRONE 0.35 MG PO TABS: 1.0000 | ORAL_TABLET | Freq: Every day | ORAL | 11 refills | Status: DC

## 2016-12-27 MED ORDER — FERROUS SULFATE 325 (65 FE) MG PO TABS: 325.0000 mg | ORAL_TABLET | Freq: Three times a day (TID) | ORAL | 3 refills | Status: DC

## 2016-12-27 NOTE — Progress Notes (Signed)
Pt and father of baby asking questions regarding breastfeeding. Pt states would like to only pump and give baby breast milk from bottle. RN minimally assisted pt to start pumping.

## 2016-12-27 NOTE — Discharge Summary (Signed)
Obstetric Discharge Summary  Patient ID: Janet Rivera MRN: [id number] DOB/AGE: October 22, 1990 26 y.o.   Date of Admission: 12/25/2016 [name], CNM Charlena Cross, MD)  Date of Discharge: [name], CNM Charlena Cross, MD)  Admitting Diagnosis: Term rupture of membranes at [redacted]w[redacted]d  Secondary Diagnosis: Anemia in pregnancy and Anxiety  Mode of Delivery: normal spontaneous vaginal delivery     Discharge Diagnosis: No other diagnosis   Intrapartum Procedures: epidural and pitocin augmentation   Post partum procedures: none  Complications: Frist degree perineal laceration and labial laceration   Brief Hospital Course   Janet Rivera is a G1P0000 who had a SVD on 12/26/2016;  for further details of this birth, please refer to the delivey note.  Patient had an uncomplicated postpartum course.  By time of discharge on PPD#1, her pain was controlled on oral pain medications; she had appropriate lochia and was ambulating, voiding without difficulty and tolerating regular diet.  She was deemed stable for discharge to home.    Labs:  CBC Latest Ref Rng & Units 12/27/2016 12/26/2016 12/25/2016  WBC 3.6 - 11.0 K/uL 18.4(H) 19.5(H) 10.2  Hemoglobin 12.0 - 16.0 g/dL 8.1(L) 8.7(L) 10.6(L)  Hematocrit 35.0 - 47.0 % 24.3(L) 26.1(L) 31.2(L)  Platelets 150 - 440 K/uL 238 225 288   O POS  Physical exam:   Temp:  [98 F (36.7 C)-98.4 F (36.9 C)] 98.2 F (36.8 C) (09/16 1159) Pulse Rate:  [87-98] 98 (09/16 0854) Resp:  [17-20] 17 (09/16 0854) BP: (113-144)/(57-66) 120/63 (09/16 0854) SpO2:  [99 %-100 %] 100 % (09/16 0854)  General: alert and no distress  Lochia: appropriate  Abdomen: soft, NT  Uterine Fundus: firm  Perienum: healing well, no significant drainage, no dehiscence, no significant erythema  Extremities: No evidence of DVT seen on physical exam. No lower extremity edema.  Discharge Instructions: Per After Visit Summary.  Activity: Advance as tolerated. Pelvic  rest for 6 weeks.  Also refer to After Visit Summary  Diet: Regular  Medications:  Allergies as of 12/27/2016   No Known Allergies     Medication List    TAKE these medications   ferrous sulfate 325 (65 FE) MG tablet Take 1 tablet (325 mg total) by mouth 3 (three) times daily with meals.   hydrocortisone cream 1 % Apply topically 4 (four) times daily.   ibuprofen 600 MG tablet Commonly known as:  ADVIL,MOTRIN Take 1 tablet (600 mg total) by mouth every 6 (six) hours.   pantoprazole 40 MG tablet Commonly known as:  PROTONIX Take by mouth.   prenatal multivitamin Tabs tablet Take 1 tablet by mouth daily at 12 noon.   ranitidine 150 MG tablet Commonly known as:  ZANTAC Take 1 tablet (150 mg total) by mouth 2 (two) times daily.   venlafaxine XR 150 MG 24 hr capsule Commonly known as:  EFFEXOR-XR Take 1 capsule (150 mg total) by mouth daily with breakfast. What changed:  medication strength  how much to take            Discharge Care Instructions        Start     Ordered   12/28/16 0000  venlafaxine XR (EFFEXOR-XR) 150 MG 24 hr capsule  Daily with breakfast    Question:  Supervising Provider  Answer:  Hildred Laser   12/27/16 1608   12/27/16 0000  ferrous sulfate 325 (65 FE) MG tablet  3 times daily with meals    Question:  Supervising Provider  Answer:  Hildred Laser  12/27/16 1608   12/27/16 0000  hydrocortisone cream 1 %  4 times daily    Question:  Supervising Provider  Answer:  Hildred Laser   12/27/16 1608   12/27/16 0000  ibuprofen (ADVIL,MOTRIN) 600 MG tablet  Every 6 hours    Question:  Supervising Provider  Answer:  Hildred Laser   12/27/16 1608     Outpatient follow up:  Follow-up Information    Gunnar Bulla, CNM. Schedule an appointment as soon as possible for a visit.   Specialties:  Certified Nurse Midwife, Obstetrics and Gynecology, Radiology Why:  Call and schedule an appointment for 1-2 week mood check following  SVD Contact information: [address] [place] [address] [phone]          Postpartum contraception: oral progesterone-only contraceptive  Discharged Condition: stable  Discharged to: home   Newborn Data:  Disposition:home with mother  Apgars: APGAR (1 MIN): 8   APGAR (5 MINS): 9     Baby Feeding: Bottle and Breast    Gunnar Bulla, CNM

## 2016-12-27 NOTE — Anesthesia Postprocedure Evaluation (Signed)
Anesthesia Post Note  Patient: Janet Rivera  Procedure(s) Performed: * No procedures listed *  Patient location during evaluation: Mother Baby Anesthesia Type: Epidural Level of consciousness: awake and alert Pain management: pain level controlled Vital Signs Assessment: post-procedure vital signs reviewed and stable Respiratory status: spontaneous breathing, nonlabored ventilation and respiratory function stable Cardiovascular status: stable Postop Assessment: no headache, no backache and patient able to bend at knees Anesthetic complications: no     Last Vitals:  Vitals:   12/27/16 0003 12/27/16 0356  BP: 131/65 (!) 113/57  Pulse: 87 94  Resp: 20 18  Temp: 36.7 C 36.9 C  SpO2:  99%    Last Pain:  Vitals:   12/27/16 0356  TempSrc: Oral  PainSc:                  Cleda Mccreedy Yliana Gravois

## 2016-12-27 NOTE — Progress Notes (Signed)
Post Partum Day 1 Subjective:  Patient sitting in bed, resting quietly. Reports intermittent back pain and discomfort.   Denies difficulty breathing or respiratory distress, chest pain, abdominal pain, excessive vaginal bleeding, and leg pain.   Objective:  Temp:  [97.9 F (36.6 C)-98.8 F (37.1 C)] 98.4 F (36.9 C) (09/16 0356) Pulse Rate:  [82-119] 94 (09/16 0356) Resp:  [17-20] 18 (09/16 0356) BP: (113-149)/(57-75) 113/57 (09/16 0356) SpO2:  [98 %-99 %] 99 % (09/16 0356)  Physical Exam:   General: alert and cooperative  Lochia: appropriate  Uterine Fundus: firm  Perineum: healing well, no significant drainage  DVT Evaluation: No evidence of DVT seen on physical exam. Negative Homan's sign.   Recent Labs  12/26/16 1648 12/27/16 0520  HGB 8.7* 8.1*  HCT 26.1* 24.3*    Assessment/Plan: Plan for discharge tomorrow, Breastfeeding, Lactation consult and Circumcision prior to discharge, Iron supplementations, see orders.    LOS: 2 days   Janet Rivera 12/27/2016, 8:43 AM

## 2016-12-27 NOTE — Progress Notes (Signed)
D/C instructions provided, pt states understanding, aware of follow up appt.      

## 2016-12-27 NOTE — Progress Notes (Signed)
Pt and family watching period of purple cry. 

## 2016-12-27 NOTE — Progress Notes (Signed)
D/C home to car via auxiliary in wheelchair.  

## 2016-12-28 LAB — RPR: RPR: NONREACTIVE

## 2016-12-29 ENCOUNTER — Encounter: Payer: BLUE CROSS/BLUE SHIELD | Admitting: Certified Nurse Midwife

## 2016-12-31 ENCOUNTER — Telehealth: Payer: Self-pay | Admitting: Certified Nurse Midwife

## 2016-12-31 NOTE — Telephone Encounter (Signed)
Patient called and stated that she is experiencing pain in her ribs, no appetite, back pain and can not hold her baby since delivering. The patient would like a call back as soon as possible to speak with Marcelino Duster or a nurse to discuss her problems. No other information was disclosed. Please advise.

## 2016-12-31 NOTE — Telephone Encounter (Signed)
Pt is 5 days s/p svd. She c/o sharp rt side rib pain and shoulder pain since delivery. Pain is a 7. Pos for nausea. NO v or d. No fever. Pos drinking. NO appetite. Pt is bottle and breast feeding. NO uit sx. Bm 2 days ago. On iron for anemia. Moderate vaginal bleeding. Ibup 600 q6. And Tylenol 500 every 8 hours. Advised pt to continue with ibup 600 q6. Take 1 gram tylenol q 6. Use heating pad. Hydrate with 4 -6 20 oz water a day. Try to eat 6 small meals. If sx no better by Monday,  to contact office for an appointment earlier than Thursday. Pt voices understanding.

## 2017-01-07 ENCOUNTER — Encounter: Payer: Self-pay | Admitting: Certified Nurse Midwife

## 2017-01-07 ENCOUNTER — Other Ambulatory Visit: Payer: Self-pay | Admitting: Certified Nurse Midwife

## 2017-01-07 ENCOUNTER — Ambulatory Visit (INDEPENDENT_AMBULATORY_CARE_PROVIDER_SITE_OTHER): Payer: BLUE CROSS/BLUE SHIELD | Admitting: Certified Nurse Midwife

## 2017-01-07 VITALS — BP 133/94 | HR 72 | Wt 183.0 lb

## 2017-01-07 DIAGNOSIS — Z79899 Other long term (current) drug therapy: Secondary | ICD-10-CM

## 2017-01-07 DIAGNOSIS — R300 Dysuria: Secondary | ICD-10-CM

## 2017-01-07 LAB — POCT URINALYSIS DIPSTICK
Bilirubin, UA: NEGATIVE
Glucose, UA: NEGATIVE
Ketones, UA: NEGATIVE
NITRITE UA: NEGATIVE
PH UA: 6 (ref 5.0–8.0)
Spec Grav, UA: 1.015 (ref 1.010–1.025)
UROBILINOGEN UA: 0.2 U/dL

## 2017-01-07 MED ORDER — RANITIDINE HCL 150 MG PO TABS: 150.0000 mg | ORAL_TABLET | Freq: Two times a day (BID) | ORAL | 4 refills | Status: DC

## 2017-01-07 MED ORDER — ONDANSETRON 4 MG PO TBDP: 4.0000 mg | ORAL_TABLET | Freq: Four times a day (QID) | ORAL | 0 refills | Status: DC | PRN

## 2017-01-07 MED ORDER — NITROFURANTOIN MONOHYD MACRO 100 MG PO CAPS: 100.0000 mg | ORAL_CAPSULE | Freq: Two times a day (BID) | ORAL | 1 refills | Status: DC

## 2017-01-07 MED ORDER — VENLAFAXINE HCL ER 75 MG PO CP24: 225.0000 mg | ORAL_CAPSULE | Freq: Every day | ORAL | 1 refills | Status: DC

## 2017-01-07 NOTE — Patient Instructions (Signed)
Venlafaxine extended-release capsules What is this medicine? VENLAFAXINE(VEN la fax een) is used to treat depression, anxiety and panic disorder. This medicine may be used for other purposes; ask your health care provider or pharmacist if you have questions. COMMON BRAND NAME(S): Effexor XR What should I tell my health care provider before I take this medicine? They need to know if you have any of these conditions: -bleeding disorders -glaucoma -heart disease -high blood pressure -high cholesterol -kidney disease -liver disease -low levels of sodium in the blood -mania or bipolar disorder -seizures -suicidal thoughts, plans, or attempt; a previous suicide attempt by you or a family -take medicines that treat or prevent blood clots -thyroid disease -an unusual or allergic reaction to venlafaxine, desvenlafaxine, other medicines, foods, dyes, or preservatives -pregnant or trying to get pregnant -breast-feeding How should I use this medicine? Take this medicine by mouth with a full glass of water. Follow the directions on the prescription label. Do not cut, crush, or chew this medicine. Take it with food. If needed, the capsule may be carefully opened and the entire contents sprinkled on a spoonful of cool applesauce. Swallow the applesauce/pellet mixture right away without chewing and follow with a glass of water to ensure complete swallowing of the pellets. Try to take your medicine at about the same time each day. Do not take your medicine more often than directed. Do not stop taking this medicine suddenly except upon the advice of your doctor. Stopping this medicine too quickly may cause serious side effects or your condition may worsen. A special MedGuide will be given to you by the pharmacist with each prescription and refill. Be sure to read this information carefully each time. Talk to your pediatrician regarding the use of this medicine in children. Special care may be  needed. Overdosage: If you think you have taken too much of this medicine contact a poison control center or emergency room at once. NOTE: This medicine is only for you. Do not share this medicine with others. What if I miss a dose? If you miss a dose, take it as soon as you can. If it is almost time for your next dose, take only that dose. Do not take double or extra doses. What may interact with this medicine? Do not take this medicine with any of the following medications: -certain medicines for fungal infections like fluconazole, itraconazole, ketoconazole, posaconazole, voriconazole -cisapride -desvenlafaxine -dofetilide -dronedarone -duloxetine -levomilnacipran -linezolid -MAOIs like Carbex, Eldepryl, Marplan, Nardil, and Parnate -methylene blue (injected into a vein) -milnacipran -pimozide -thioridazine -ziprasidone This medicine may also interact with the following medications: -amphetamines -aspirin and aspirin-like medicines -certain medicines for depression, anxiety, or psychotic disturbances -certain medicines for migraine headaches like almotriptan, eletriptan, frovatriptan, naratriptan, rizatriptan, sumatriptan, zolmitriptan -certain medicines for sleep -certain medicines that treat or prevent blood clots like dalteparin, enoxaparin, warfarin -cimetidine -clozapine -diuretics -fentanyl -furazolidone -indinavir -isoniazid -lithium -metoprolol -NSAIDS, medicines for pain and inflammation, like ibuprofen or naproxen -other medicines that prolong the QT interval (cause an abnormal heart rhythm) -procarbazine -rasagiline -supplements like St. John's wort, kava kava, valerian -tramadol -tryptophan This list may not describe all possible interactions. Give your health care provider a list of all the medicines, herbs, non-prescription drugs, or dietary supplements you use. Also tell them if you smoke, drink alcohol, or use illegal drugs. Some items may interact with  your medicine. What should I watch for while using this medicine? Tell your doctor if your symptoms do not get better or if they get   worse. Visit your doctor or health care professional for regular checks on your progress. Because it may take several weeks to see the full effects of this medicine, it is important to continue your treatment as prescribed by your doctor. Patients and their families should watch out for new or worsening thoughts of suicide or depression. Also watch out for sudden changes in feelings such as feeling anxious, agitated, panicky, irritable, hostile, aggressive, impulsive, severely restless, overly excited and hyperactive, or not being able to sleep. If this happens, especially at the beginning of treatment or after a change in dose, call your health care professional. This medicine can cause an increase in blood pressure. Check with your doctor for instructions on monitoring your blood pressure while taking this medicine. You may get drowsy or dizzy. Do not drive, use machinery, or do anything that needs mental alertness until you know how this medicine affects you. Do not stand or sit up quickly, especially if you are an older patient. This reduces the risk of dizzy or fainting spells. Alcohol may interfere with the effect of this medicine. Avoid alcoholic drinks. Your mouth may get dry. Chewing sugarless gum, sucking hard candy and drinking plenty of water will help. Contact your doctor if the problem does not go away or is severe. What side effects may I notice from receiving this medicine? Side effects that you should report to your doctor or health care professional as soon as possible: -allergic reactions like skin rash, itching or hives, swelling of the face, lips, or tongue -anxious -breathing problems -confusion -changes in vision -chest pain -confusion -elevated mood, decreased need for sleep, racing thoughts, impulsive behavior -eye pain -fast, irregular  heartbeat -feeling faint or lightheaded, falls -feeling agitated, angry, or irritable -hallucination, loss of contact with reality -high blood pressure -loss of balance or coordination -palpitations -redness, blistering, peeling or loosening of the skin, including inside the mouth -restlessness, pacing, inability to keep still -seizures -stiff muscles -suicidal thoughts or other mood changes -trouble passing urine or change in the amount of urine -trouble sleeping -unusual bleeding or bruising -unusually weak or tired -vomiting Side effects that usually do not require medical attention (report to your doctor or health care professional if they continue or are bothersome): -change in sex drive or performance -change in appetite or weight -constipation -dizziness -dry mouth -headache -increased sweating -nausea -tired This list may not describe all possible side effects. Call your doctor for medical advice about side effects. You may report side effects to FDA at 1-800-FDA-1088. Where should I keep my medicine? Keep out of the reach of children. Store at a controlled temperature between 20 and 25 degrees C (68 degrees and 77 degrees F), in a dry place. Throw away any unused medicine after the expiration date. NOTE: This sheet is a summary. It may not cover all possible information. If you have questions about this medicine, talk to your doctor, pharmacist, or health care provider.  2018 [employer]/Gold Standard (2015-08-29 18:38:02)  

## 2017-01-07 NOTE — Progress Notes (Signed)
GYN ENCOUNTER NOTE  Subjective:       Janet Rivera is a 26 y.o. G1P1 female her two (2) weeks postpartum for mood check and medication follow up.   Effexor increased to 150 mg daily in the immediate postpartum period. Patient desires increased dose at this time. She also reports pain with urination, reflux and occasional nausea.   No suicidal or homicidal ideation. Denies difficulty breathing or respiratory distress, chest pain, abdominal pain, excessive vaginal bleeding, and leg pain or swelling.   Past Medical History:  Diagnosis Date  . Acid reflux   . Anxiety   . Depression   . Nausea/vomiting in pregnancy     Past Surgical History:  Procedure Laterality Date  . NO PAST SURGERIES      Current Outpatient Prescriptions on File Prior to Visit  Medication Sig Dispense Refill  . ferrous sulfate 325 (65 FE) MG tablet Take 1 tablet (325 mg total) by mouth 3 (three) times daily with meals. 90 tablet 3  . ibuprofen (ADVIL,MOTRIN) 600 MG tablet Take 1 tablet (600 mg total) by mouth every 6 (six) hours. 30 tablet 0  . norethindrone (MICRONOR,CAMILA,ERRIN) 0.35 MG tablet Take 1 tablet (0.35 mg total) by mouth daily. 1 Package 11  . pantoprazole (PROTONIX) 40 MG tablet Take by mouth.    . hydrocortisone cream 1 % Apply topically 4 (four) times daily. (Patient not taking: Reported on 01/07/2017) 30 g 0  . Prenatal Vit-Fe Fumarate-FA (PRENATAL MULTIVITAMIN) TABS tablet Take 1 tablet by mouth daily at 12 noon.     No current facility-administered medications on file prior to visit.     No Known Allergies  Social History   Social History  . Marital status: Married    Spouse name: N/A  . Number of children: One (1)  . Years of education: N/A   Occupational History  . Not on file.   Social History Main Topics  . Smoking status: Never Smoker  . Smokeless tobacco: Never Used  . Alcohol use No  . Drug use: No  . Sexual activity: Yes    Birth control/ protection: None   Other  Topics Concern  . Not on file   Social History Narrative  . No narrative on file    Family History  Problem Relation Age of Onset  . Thyroid disease Mother   . Rheum arthritis Father   . Diabetes Father   . Diabetes Maternal Grandmother   . Breast cancer Paternal Grandmother   . Rheum arthritis Paternal Grandfather     The following portions of the patient's history were reviewed and updated as appropriate: allergies, current medications, past family history, past medical history, past social history, past surgical history and problem list.  Review of Systems  Review of Systems - Negative except as noted above.  History obtained from the patient.   Objective:   BP (!) 133/94   Pulse 72   Wt 183 lb (83 kg)   Breastfeeding? Yes   BMI 32.42 kg/m   Alert and oriented x 4, no apparent distress.   Depression screen PHQ 2/9 01/07/2017  Decreased Interest 1  Down, Depressed, Hopeless 1  PHQ - 2 Score 2  Altered sleeping 3  Tired, decreased energy 2  Change in appetite 3  Feeling bad or failure about yourself  0  Trouble concentrating 0  Moving slowly or fidgety/restless 0  Suicidal thoughts 0  PHQ-9 Score 10  Difficult doing work/chores Somewhat difficult    Assessment:  1. Burning with urination  - POCT urinalysis dipstick - Urine Culture  2. Medication dose changed   Plan:   Labs: urine culture; will contact pt via MyChart with results.   Increase Effexor to 225 mg PO daily. Patient was on 300 mg PO daily prior to pregnancy.   Rx: Macrobid, Effexor, Zantac, and Zofran; see orders.   Reviewed red flag symptoms and when to call.

## 2017-01-09 LAB — URINE CULTURE

## 2017-02-05 ENCOUNTER — Encounter: Payer: Self-pay | Admitting: Certified Nurse Midwife

## 2017-02-05 ENCOUNTER — Ambulatory Visit (INDEPENDENT_AMBULATORY_CARE_PROVIDER_SITE_OTHER): Payer: BLUE CROSS/BLUE SHIELD | Admitting: Certified Nurse Midwife

## 2017-02-05 NOTE — Progress Notes (Signed)
Subjective:    Janet Rivera is a 26 y.o. G1P1 Caucasian female who presents for a postpartum visit. She is 6 weeks postpartum following a spontaneous vaginal birth at 39+4 gestational weeks. Anesthesia: epidural. I have fully reviewed the prenatal and intrapartum course.  Postpartum course has been complicated by depression. Baby's course has been uncomplicated. Baby is feeding by formula feeding. Bleeding no bleeding. Bowel function is normal. Bladder function is normal.   Patient is not sexually active. Last sexual activity: prior to birth of infant. Contraception method is oral progesterone-only contraceptive. Postpartum depression screening: positive. Score 17.  Last pap 05/2016 and was negative.  The following portions of the patient's history were reviewed and updated as appropriate: allergies, current medications, past medical history, past surgical history and problem list.  Review of Systems  Pertinent items are noted in HPI.   Objective:   BP 125/84 (BP Location: Right Arm, Patient Position: Sitting, Cuff Size: Normal)   Pulse 85   Ht 5\' 3"  (1.6 m)   Wt 183 lb 3.2 oz (83.1 kg)   BMI 32.45 kg/m   General:  alert, cooperative and no distress   Breasts:  deferred, no complaints  Lungs: clear to auscultation bilaterally  Heart:  regular rate and rhythm  Abdomen: soft, nontender   Vulva: normal  Vagina: normal vagina  Cervix:  closed  Corpus: Well-involuted  Adnexa:  Non-palpable   Depression screen PHQ 2/9 02/05/2017  Decreased Interest 2  Down, Depressed, Hopeless 2  PHQ - 2 Score 4  Altered sleeping 3  Tired, decreased energy 3  Change in appetite 3  Feeling bad or failure about yourself  2  Trouble concentrating 2  Moving slowly or fidgety/restless 0  Suicidal thoughts 0  PHQ-9 Score 17  Difficult doing work/chores Very difficult     Assessment:   Postpartum exam Six (6) wks s/p vaginal birth Formulafeeding Depression screening Contraception  counseling   Plan:   Rx: Junel, see orders.   May increase Effexor to 350 mg PO daily, dose prior to pregnancy.  Reviewed red flag symptoms and when to call.   Follow up in: 4 weeks for annual exam or earlier if needed   Gunnar BullaJenkins Michelle Lailynn Southgate, CNM

## 2017-02-05 NOTE — Patient Instructions (Addendum)
Preventive Care 18-39 Years, Female Preventive care refers to lifestyle choices and visits with your health care provider that can promote health and wellness. What does preventive care include?  A yearly physical exam. This is also called an annual well check.  Dental exams once or twice a year.  Routine eye exams. Ask your health care provider how often you should have your eyes checked.  Personal lifestyle choices, including: ? Daily care of your teeth and gums. ? Regular physical activity. ? Eating a healthy diet. ? Avoiding tobacco and drug use. ? Limiting alcohol use. ? Practicing safe sex. ? Taking vitamin and mineral supplements as recommended by your health care provider. What happens during an annual well check? The services and screenings done by your health care provider during your annual well check will depend on your age, overall health, lifestyle risk factors, and family history of disease. Counseling Your health care provider may ask you questions about your:  Alcohol use.  Tobacco use.  Drug use.  Emotional well-being.  Home and relationship well-being.  Sexual activity.  Eating habits.  Work and work Statistician.  Method of birth control.  Menstrual cycle.  Pregnancy history.  Screening You may have the following tests or measurements:  Height, weight, and BMI.  Diabetes screening. This is done by checking your blood sugar (glucose) after you have not eaten for a while (fasting).  Blood pressure.  Lipid and cholesterol levels. These may be checked every 5 years starting at age 3.  Skin check.  Hepatitis C blood test.  Hepatitis B blood test.  Sexually transmitted disease (STD) testing.  BRCA-related cancer screening. This may be done if you have a family history of breast, ovarian, tubal, or peritoneal cancers.  Pelvic exam and Pap test. This may be done every 3 years starting at age 54. Starting at age 70, this may be done every 5  years if you have a Pap test in combination with an HPV test.  Discuss your test results, treatment options, and if necessary, the need for more tests with your health care provider. Vaccines Your health care provider may recommend certain vaccines, such as:  Influenza vaccine. This is recommended every year.  Tetanus, diphtheria, and acellular pertussis (Tdap, Td) vaccine. You may need a Td booster every 10 years.  Varicella vaccine. You may need this if you have not been vaccinated.  HPV vaccine. If you are 68 or younger, you may need three doses over 6 months.  Measles, mumps, and rubella (MMR) vaccine. You may need at least one dose of MMR. You may also need a second dose.  Pneumococcal 13-valent conjugate (PCV13) vaccine. You may need this if you have certain conditions and were not previously vaccinated.  Pneumococcal polysaccharide (PPSV23) vaccine. You may need one or two doses if you smoke cigarettes or if you have certain conditions.  Meningococcal vaccine. One dose is recommended if you are age 37-21 years and a first-year college student living in a residence hall, or if you have one of several medical conditions. You may also need additional booster doses.  Hepatitis A vaccine. You may need this if you have certain conditions or if you travel or work in places where you may be exposed to hepatitis A.  Hepatitis B vaccine. You may need this if you have certain conditions or if you travel or work in places where you may be exposed to hepatitis B.  Haemophilus influenzae type b (Hib) vaccine. You may need this if  you have certain risk factors.  Talk to your health care provider about which screenings and vaccines you need and how often you need them. This information is not intended to replace advice given to you by your health care provider. Make sure you discuss any questions you have with your health care provider. Document Released: 05/26/2001 Document Revised: 12/18/2015  Document Reviewed: 01/29/2015 Elsevier Interactive Patient Education  2017 Elsevier Inc.  Ethinyl Estradiol; Norethindrone Acetate; Ferrous fumarate tablets or capsules What is this medicine? ETHINYL ESTRADIOL; NORETHINDRONE ACETATE; FERROUS FUMARATE (ETH in il es tra DYE ole; nor eth IN drone AS e tate; FER us FUE ma rate) is an oral contraceptive. The products combine two types of female hormones, an estrogen and a progestin. They are used to prevent ovulation and pregnancy. Some products are also used to treat acne in females. This medicine may be used for other purposes; ask your health care provider or pharmacist if you have questions. COMMON BRAND NAME(S): Blisovi 24 Fe, Blisovi Fe, Estrostep Fe, Gildess 24 Fe, Gildess Fe 1.5/30, Gildess Fe 1/20, Junel Fe 1.5/30, Junel Fe 1/20, Junel Fe 24, Larin Fe, Lo Loestrin Fe, Loestrin 24 Fe, Loestrin FE 1.5/30, Loestrin FE 1/20, Lomedia 24 Fe, Microgestin 24 Fe, Microgestin Fe 1.5/30, Microgestin Fe 1/20, Tarina Fe 1/20, Taytulla, Tilia Fe, Tri-Legest Fe What should I tell my health care provider before I take this medicine? They need to know if you have any of these conditions: -abnormal vaginal bleeding -blood vessel disease -breast, cervical, endometrial, ovarian, liver, or uterine cancer -diabetes -gallbladder disease -heart disease or recent heart attack -high blood pressure -high cholesterol -history of blood clots -kidney disease -liver disease -migraine headaches -smoke tobacco -stroke -systemic lupus erythematosus (SLE) -an unusual or allergic reaction to estrogens, progestins, other medicines, foods, dyes, or preservatives -pregnant or trying to get pregnant -breast-feeding How should I use this medicine? Take this medicine by mouth. To reduce nausea, this medicine may be taken with food. Follow the directions on the prescription label. Take this medicine at the same time each day and in the order directed on the package. Do not  take your medicine more often than directed. A patient package insert for the product will be given with each prescription and refill. Read this sheet carefully each time. The sheet may change frequently. Contact your pediatrician regarding the use of this medicine in children. Special care may be needed. This medicine has been used in female children who have started having menstrual periods. Overdosage: If you think you have taken too much of this medicine contact a poison control center or emergency room at once. NOTE: This medicine is only for you. Do not share this medicine with others. What if I miss a dose? If you miss a dose, refer to the patient information sheet you received with your medicine for direction. If you miss more than one pill, this medicine may not be as effective and you may need to use another form of birth control. What may interact with this medicine? Do not take this medicine with the following medication: -dasabuvir; ombitasvir; paritaprevir; ritonavir -ombitasvir; paritaprevir; ritonavir This medicine may also interact with the following medications: -acetaminophen -antibiotics or medicines for infections, especially rifampin, rifabutin, rifapentine, and griseofulvin, and possibly penicillins or tetracyclines -aprepitant -ascorbic acid (vitamin C) -atorvastatin -barbiturate medicines, such as phenobarbital -bosentan -carbamazepine -caffeine -clofibrate -cyclosporine -dantrolene -doxercalciferol -felbamate -grapefruit juice -hydrocortisone -medicines for anxiety or sleeping problems, such as diazepam or temazepam -medicines for diabetes, including pioglitazone -mineral   oil -modafinil -mycophenolate -nefazodone -oxcarbazepine -phenytoin -prednisolone -ritonavir or other medicines for HIV infection or AIDS -rosuvastatin -selegiline -soy isoflavones supplements -St. John's wort -tamoxifen or raloxifene -theophylline -thyroid  hormones -topiramate -warfarin This list may not describe all possible interactions. Give your health care provider a list of all the medicines, herbs, non-prescription drugs, or dietary supplements you use. Also tell them if you smoke, drink alcohol, or use illegal drugs. Some items may interact with your medicine. What should I watch for while using this medicine? Visit your doctor or health care professional for regular checks on your progress. You will need a regular breast and pelvic exam and Pap smear while on this medicine. Use an additional method of contraception during the first cycle that you take these tablets. If you have any reason to think you are pregnant, stop taking this medicine right away and contact your doctor or health care professional. If you are taking this medicine for hormone related problems, it may take several cycles of use to see improvement in your condition. Smoking increases the risk of getting a blood clot or having a stroke while you are taking birth control pills, especially if you are more than 26 years old. You are strongly advised not to smoke. This medicine can make your body retain fluid, making your fingers, hands, or ankles swell. Your blood pressure can go up. Contact your doctor or health care professional if you feel you are retaining fluid. This medicine can make you more sensitive to the sun. Keep out of the sun. If you cannot avoid being in the sun, wear protective clothing and use sunscreen. Do not use sun lamps or tanning beds/booths. If you wear contact lenses and notice visual changes, or if the lenses begin to feel uncomfortable, consult your eye care specialist. In some women, tenderness, swelling, or minor bleeding of the gums may occur. Notify your dentist if this happens. Brushing and flossing your teeth regularly may help limit this. See your dentist regularly and inform your dentist of the medicines you are taking. If you are going to have  elective surgery, you may need to stop taking this medicine before the surgery. Consult your health care professional for advice. This medicine does not protect you against HIV infection (AIDS) or any other sexually transmitted diseases. What side effects may I notice from receiving this medicine? Side effects that you should report to your doctor or health care professional as soon as possible: -allergic reactions like skin rash, itching or hives, swelling of the face, lips, or tongue -breast tissue changes or discharge -changes in vaginal bleeding during your period or between your periods -changes in vision -chest pain -confusion -coughing up blood -dizziness -feeling faint or lightheaded -headaches or migraines -leg, arm or groin pain -loss of balance or coordination -severe or sudden headaches -stomach pain (severe) -sudden shortness of breath -sudden numbness or weakness of the face, arm or leg -symptoms of vaginal infection like itching, irritation or unusual discharge -tenderness in the upper abdomen -trouble speaking or understanding -vomiting -yellowing of the eyes or skin Side effects that usually do not require medical attention (report to your doctor or health care professional if they continue or are bothersome): -breakthrough bleeding and spotting that continues beyond the 3 initial cycles of pills -breast tenderness -mood changes, anxiety, depression, frustration, anger, or emotional outbursts -increased sensitivity to sun or ultraviolet light -nausea -skin rash, acne, or brown spots on the skin -weight gain (slight) This list may not describe all  possible side effects. Call your doctor for medical advice about side effects. You may report side effects to FDA at 1-800-FDA-1088. Where should I keep my medicine? Keep out of the reach of children. Store at room temperature between 15 and 30 degrees C (59 and 86 degrees F). Throw away any unused medicine after the  expiration date. NOTE: This sheet is a summary. It may not cover all possible information. If you have questions about this medicine, talk to your doctor, pharmacist, or health care provider.  2018 Elsevier/Gold Standard (2015-12-09 08:04:41)

## 2017-02-06 LAB — CBC
HEMATOCRIT: 38.9 % (ref 34.0–46.6)
HEMOGLOBIN: 12.1 g/dL (ref 11.1–15.9)
MCH: 25.5 pg — ABNORMAL LOW (ref 26.6–33.0)
MCHC: 31.1 g/dL — AB (ref 31.5–35.7)
MCV: 82 fL (ref 79–97)
Platelets: 387 10*3/uL — ABNORMAL HIGH (ref 150–379)
RBC: 4.75 x10E6/uL (ref 3.77–5.28)
RDW: 16.4 % — ABNORMAL HIGH (ref 12.3–15.4)
WBC: 6 10*3/uL (ref 3.4–10.8)

## 2017-02-10 ENCOUNTER — Telehealth: Payer: Self-pay | Admitting: Certified Nurse Midwife

## 2017-02-10 NOTE — Telephone Encounter (Signed)
Patient called and lvm stating that her medication was not sent to her pharmacy. The patient stated that her preferred pharmacy is [facility],the patient also disclosed that there was an issue with her FMLA paperwork. Patient did not disclose any other information. Please advise.

## 2017-02-12 MED ORDER — NORETHINDRONE ACET-ETHINYL EST 1.5-30 MG-MCG PO TABS: 1.0000 | ORAL_TABLET | Freq: Every day | ORAL | 11 refills | Status: DC

## 2017-02-12 NOTE — Telephone Encounter (Signed)
Pt calls inquiring about note sent for work. This was faxed on 02/08/2017, confirmation received. Also after checking with JML, CNM, pt may have Junel FE 1.5/30.  Left message that rx was sent in to pharmacy.

## 2017-02-13 ENCOUNTER — Encounter: Payer: Self-pay | Admitting: Certified Nurse Midwife

## 2017-02-15 ENCOUNTER — Telehealth: Payer: Self-pay | Admitting: Certified Nurse Midwife

## 2017-02-15 ENCOUNTER — Other Ambulatory Visit: Payer: Self-pay | Admitting: Certified Nurse Midwife

## 2017-02-15 MED ORDER — NORETHINDRONE ACET-ETHINYL EST 1.5-30 MG-MCG PO TABS: 1.0000 | ORAL_TABLET | Freq: Every day | ORAL | 11 refills | Status: AC

## 2017-02-15 MED ORDER — VENLAFAXINE HCL ER 150 MG PO CP24: 300.0000 mg | ORAL_CAPSULE | Freq: Every day | ORAL | 5 refills | Status: DC

## 2017-02-15 NOTE — Telephone Encounter (Signed)
Just sent Rx for effexor xr 300 mg PO daily and Junel 1.5/30 to [facility]. May sent in again or contact pharmacy if issues continue. Thanks, JML

## 2017-02-15 NOTE — Telephone Encounter (Signed)
Patient called and stated that she would like a nurse to call her back so she can verify why she needs to be out another 6 weeks on disability due to her post partum depression. No other information was disclosed. Please advise.

## 2017-02-18 NOTE — Telephone Encounter (Signed)
Papers have been filled out and faxed.

## 2017-02-22 ENCOUNTER — Other Ambulatory Visit: Payer: Self-pay

## 2017-02-23 ENCOUNTER — Encounter: Payer: Self-pay | Admitting: Podiatry

## 2017-02-23 ENCOUNTER — Ambulatory Visit (INDEPENDENT_AMBULATORY_CARE_PROVIDER_SITE_OTHER): Payer: BLUE CROSS/BLUE SHIELD | Admitting: Podiatry

## 2017-02-23 DIAGNOSIS — L600 Ingrowing nail: Secondary | ICD-10-CM | POA: Diagnosis not present

## 2017-02-23 MED ORDER — GENTAMICIN SULFATE 0.1 % EX CREA: 1.0000 "application " | TOPICAL_CREAM | Freq: Three times a day (TID) | CUTANEOUS | 1 refills | Status: DC

## 2017-02-23 NOTE — Patient Instructions (Signed)

## 2017-02-25 NOTE — Progress Notes (Signed)
   Subjective: Patient presents today for evaluation of pain to the medial and lateral borders of the right great toenail as well as to the medial border of the left great toenail that began about two days ago. She reports associated yellow drainage from the areas. Patient is concerned for possible ingrown nails. Patient presents today for further treatment and evaluation.   Past Medical History:  Diagnosis Date  . Acid reflux   . Anxiety   . Depression     Objective:  General: Well developed, nourished, in no acute distress, alert and oriented x3   Dermatology: Skin is warm, dry and supple bilateral. Medial and lateral borders of the right great toe and medial border of the left great toe appears to be erythematous with evidence of an ingrowing nail. Pain on palpation noted to the border of the nail fold. The remaining nails appear unremarkable at this time. There are no open sores, lesions.  Vascular: Dorsalis Pedis artery and Posterior Tibial artery pedal pulses palpable. No lower extremity edema noted.   Neruologic: Grossly intact via light touch bilateral.  Musculoskeletal: Muscular strength within normal limits in all groups bilateral. Normal range of motion noted to all pedal and ankle joints.   Assesement: #1 Paronychia with ingrowing nail medial and lateral borders of the right great toe and medial border of the left great toe #2 Pain in toes #3 Incurvated nails  Plan of Care:  1. Patient evaluated.  2. Discussed treatment alternatives and plan of care. Explained nail avulsion procedure and post procedure course to patient. 3. Patient opted for permanent partial nail avulsion.  4. Prior to procedure, local anesthesia infiltration utilized using 3 ml of a 50:50 mixture of 2% plain lidocaine and 0.5% plain marcaine in a normal hallux block fashion and a betadine prep performed.  5. Partial permanent nail avulsion with chemical matrixectomy performed using 3x30sec applications  of phenol followed by alcohol flush.  6. Light dressing applied. 7. Return to clinic in 2 weeks.   Felecia ShellingBrent M. Rayona Sardinha, DPM [facility]  Dr. Felecia ShellingBrent M. Creg Gilmer, DPM    [address]                                        [place], [place] [address]                Office [phone]  Fax [phone]

## 2017-03-08 ENCOUNTER — Telehealth: Payer: Self-pay | Admitting: Obstetrics and Gynecology

## 2017-03-08 NOTE — Telephone Encounter (Signed)
Patient called asking wether or not if her disability forms have been faxed the patient stated that the company called her and stated that they have yet to receive any documents pertaining to her. The patient would also like to confirm the correct fax number. No other information was disclosed.

## 2017-03-09 ENCOUNTER — Ambulatory Visit: Payer: BLUE CROSS/BLUE SHIELD | Admitting: Podiatry

## 2017-03-10 NOTE — Telephone Encounter (Signed)
Pt was given copy of her fmla paperwork

## 2017-03-12 ENCOUNTER — Telehealth: Payer: Self-pay | Admitting: Certified Nurse Midwife

## 2017-03-12 NOTE — Telephone Encounter (Signed)
Patient called stating the wrong birth control was sent to her pharmacy. She states it should be junel. Thanks

## 2017-03-16 ENCOUNTER — Encounter: Payer: BLUE CROSS/BLUE SHIELD | Admitting: Podiatry

## 2017-03-16 NOTE — Telephone Encounter (Signed)
Unable to reach pt, voice mail box full. Junel rx was sent in on 02/15/2017 with [facility] ref[facility] [place].

## 2017-03-17 ENCOUNTER — Telehealth: Payer: Self-pay | Admitting: Certified Nurse Midwife

## 2017-03-17 NOTE — Telephone Encounter (Signed)
The patient called and stated that she would like to speak with a nurse in regards to a fax being sent and also her prescription for B.C. No other information was disclosed. Please advise.

## 2017-03-23 NOTE — Telephone Encounter (Signed)
Pt states junel 1.5/30 is not at pharmacy. [facility].

## 2017-03-23 NOTE — Progress Notes (Signed)
This encounter was created in error - please disregard.

## 2017-03-23 NOTE — Telephone Encounter (Signed)
lmtrc

## 2017-03-30 ENCOUNTER — Encounter: Payer: BLUE CROSS/BLUE SHIELD | Admitting: Podiatry

## 2017-03-31 ENCOUNTER — Encounter: Payer: BLUE CROSS/BLUE SHIELD | Admitting: Certified Nurse Midwife

## 2017-04-03 NOTE — Progress Notes (Signed)
This encounter was created in error - please disregard.

## 2017-04-22 ENCOUNTER — Encounter: Payer: BLUE CROSS/BLUE SHIELD | Admitting: Certified Nurse Midwife

## 2017-05-11 ENCOUNTER — Other Ambulatory Visit: Payer: Self-pay | Admitting: Certified Nurse Midwife

## 2017-05-18 ENCOUNTER — Other Ambulatory Visit: Payer: Self-pay

## 2017-05-18 MED ORDER — RANITIDINE HCL 150 MG PO TABS: 150.0000 mg | ORAL_TABLET | Freq: Two times a day (BID) | ORAL | 0 refills | Status: DC

## 2017-06-08 ENCOUNTER — Encounter: Payer: BLUE CROSS/BLUE SHIELD | Admitting: Certified Nurse Midwife

## 2017-07-14 ENCOUNTER — Other Ambulatory Visit: Payer: Self-pay | Admitting: Certified Nurse Midwife

## 2017-09-13 ENCOUNTER — Other Ambulatory Visit: Payer: Self-pay | Admitting: Certified Nurse Midwife

## 2017-09-15 ENCOUNTER — Encounter: Payer: Self-pay | Admitting: Certified Nurse Midwife

## 2017-09-16 ENCOUNTER — Ambulatory Visit (INDEPENDENT_AMBULATORY_CARE_PROVIDER_SITE_OTHER): Payer: BLUE CROSS/BLUE SHIELD | Admitting: Certified Nurse Midwife

## 2017-09-16 VITALS — BP 121/88 | HR 85 | Ht 63.0 in | Wt 188.2 lb

## 2017-09-16 DIAGNOSIS — R5383 Other fatigue: Secondary | ICD-10-CM | POA: Diagnosis not present

## 2017-09-16 DIAGNOSIS — F32A Depression, unspecified: Secondary | ICD-10-CM

## 2017-09-16 DIAGNOSIS — F329 Major depressive disorder, single episode, unspecified: Secondary | ICD-10-CM | POA: Diagnosis not present

## 2017-09-16 DIAGNOSIS — F411 Generalized anxiety disorder: Secondary | ICD-10-CM

## 2017-09-16 DIAGNOSIS — R6882 Decreased libido: Secondary | ICD-10-CM | POA: Diagnosis not present

## 2017-09-16 DIAGNOSIS — N941 Unspecified dyspareunia: Secondary | ICD-10-CM

## 2017-09-16 MED ORDER — RANITIDINE HCL 150 MG PO TABS: 150.0000 mg | ORAL_TABLET | Freq: Two times a day (BID) | ORAL | 5 refills | Status: DC

## 2017-09-16 MED ORDER — VENLAFAXINE HCL ER 37.5 MG PO CP24: 37.5000 mg | ORAL_CAPSULE | Freq: Every day | ORAL | 5 refills | Status: DC

## 2017-09-16 MED ORDER — VENLAFAXINE HCL ER 150 MG PO CP24: 300.0000 mg | ORAL_CAPSULE | Freq: Every day | ORAL | 5 refills | Status: DC

## 2017-09-16 NOTE — Progress Notes (Signed)
Pt is here to review meds. Also c/o painful intercourse.

## 2017-09-16 NOTE — Patient Instructions (Signed)
Dyspareunia, Female Dyspareunia is pain that is associated with sexual activity. This can affect any part of the genitals or lower abdomen, and there are many possible causes. This condition ranges from mild to severe. Depending on the cause, dyspareunia may get better with treatment, or it may return (recur) over time. What are the causes? The cause of this condition is not always known. Possible causes include:  Cancer.  Psychological factors, such as depression, anxiety, or previous traumatic experiences.  Severe pain and tenderness of the skin around the vagina (vulva) when it is touched (vulvar vestibulitis syndrome).  Infection of the pelvis or the vulva.  Infection of the vagina.  Painful, involuntary tightening (contraction) of the vaginal muscles when anything is put inside the vagina (vaginismus).  Allergic reaction.  Ovarian cysts.  Solid growths of tissue (tumors) in the ovaries or the uterus.  Scar tissue in the ovaries, vagina, or pelvis.  Vaginal dryness.  Thinning of the tissue (atrophy) of the vulva and vagina.  Skin conditions that affect the vulva (vulvar dermatoses), such as lichen sclerosus or lichen planus.  Endometriosis.  Tubal pregnancy.  A tilted uterus.  Uterine prolapse.  Adhesions in the vagina.  Bladder problems.  Intestinal problems.  Certain medicines.  Medical conditions such as diabetes, arthritis, or thyroid disease.  What increases the risk? The following factors may make you more likely to develop this condition:  Having experienced physical or sexual trauma.  Having given birth more than once.  Taking birth control pills.  Having gone through menopause.  Having recently given birth, typically within the past 3-6 months.  Breastfeeding.  What are the signs or symptoms? The main symptom of this condition is pain in any part of the genitals or lower abdomen during or after sexual activity. This may include pain  during sexual arousal, genital stimulation, or orgasm. Pain may get worse when anything is inserted into the vagina, or when the genitals are touched in any way, such as when sitting or wearing pants. Pain can range from mild to severe, depending on the cause of the condition. In some cases, symptoms go away with treatment and return (recur) at a later date. How is this diagnosed? This condition may be diagnosed based on:  Your symptoms, including: ? Where your pain is located. ? When your pain occurs.  Your medical history.  A physical exam. This may include a pelvic exam and a Pap test. This is a screening test that is used to check for signs of cancer of the vagina, cervix, and uterus.  Tests, including: ? Blood tests. ? Ultrasound. This uses sound waves to make a picture of the area that is being tested. ? Urine culture. This test involves checking a urine sample for signs of infection. ? Culture test. This is when your health care provider uses a swab to collect a sample of vaginal fluid. The sample is checked for signs of infection. ? X-rays. ? MRI. ? CT scan. ? Laparoscopy. This is a procedure in which a small incision is made in your lower abdomen and a lighted, pencil-sized instrument (laparoscope) is passed through the incision and used to look inside your pelvis.  You may be referred to a health care provider who specializes in women's health (gynecologist). In some cases, diagnosing the cause of dyspareunia can be difficult. How is this treated? Treatment depends on the cause of your condition and your symptoms. In most cases, you may need to stop sexual activity until your symptoms   improve. Treatment may include:  Lubricants.  Kegel exercises or vaginal dilators.  Medicated skin creams.  Medicated vaginal creams.  Hormonal therapy.  Antibiotic medicine to prevent or fight infection.  Medicines that help to relieve pain.  Medicines that treat depression  (antidepressants).  Psychological counseling.  Sex therapy.  Surgery.  Follow these instructions at home: Lifestyle  Avoid tight clothing and irritating materials around your genital and abdominal area.  Use water-based lubricants as needed. Avoid oil-based lubricants.  Do not use any products that irritate you. This may include certain condoms, spermicides, lubricants, soaps, tampons, vaginal sprays, or douches.  Always practice safe sex. Talk with your health care provider about which form of birth control (contraception) is best for you.  Maintain open communication with your sexual partner. General instructions  Take over-the-counter and prescription medicines only as told by your health care provider.  If you had tests done, it is your responsibility to get your tests results. Ask your health care provider or the department performing the test when your results will be ready.  Urinate before you engage in sexual activity.  Consider joining a support group.  Keep all follow-up visits as told by your health care provider. This is important. Contact a health care provider if:  You develop vaginal bleeding after sexual intercourse.  You develop a lump at the opening of your vagina. Seek medical care even if the lump is painless.  You have: ? Abnormal vaginal discharge. ? Vaginal dryness. ? Itchiness or irritation of your vulva or vagina. ? A new rash. ? Symptoms that get worse or do not improve with treatment. ? A fever. ? Pain when you urinate. ? Blood in your urine. Get help right away if:  You develop severe pain in your abdomen during or shortly after sexual intercourse.  You pass out after having sexual intercourse. This information is not intended to replace advice given to you by your health care provider. Make sure you discuss any questions you have with your health care provider. Document Released: 04/19/2007 Document Revised: 08/09/2015 Document  Reviewed: 10/30/2014 Elsevier Interactive Patient Education  2018 [place] 18-39 Years, Female Preventive care refers to lifestyle choices and visits with your health care provider that can promote health and wellness. What does preventive care include?  A yearly physical exam. This is also called an annual well check.  Dental exams once or twice a year.  Routine eye exams. Ask your health care provider how often you should have your eyes checked.  Personal lifestyle choices, including: ? Daily care of your teeth and gums. ? Regular physical activity. ? Eating a healthy diet. ? Avoiding tobacco and drug use. ? Limiting alcohol use. ? Practicing safe sex. ? Taking vitamin and mineral supplements as recommended by your health care provider. What happens during an annual well check? The services and screenings done by your health care provider during your annual well check will depend on your age, overall health, lifestyle risk factors, and family history of disease. Counseling Your health care provider may ask you questions about your:  Alcohol use.  Tobacco use.  Drug use.  Emotional well-being.  Home and relationship well-being.  Sexual activity.  Eating habits.  Work and work Statistician.  Method of birth control.  Menstrual cycle.  Pregnancy history.  Screening You may have the following tests or measurements:  Height, weight, and BMI.  Diabetes screening. This is done by checking your blood sugar (glucose) after you have not  eaten for a while (fasting).  Blood pressure.  Lipid and cholesterol levels. These may be checked every 5 years starting at age 92.  Skin check.  Hepatitis C blood test.  Hepatitis B blood test.  Sexually transmitted disease (STD) testing.  BRCA-related cancer screening. This may be done if you have a family history of breast, ovarian, tubal, or peritoneal cancers.  Pelvic exam and Pap test. This may be  done every 3 years starting at age 28. Starting at age 10, this may be done every 5 years if you have a Pap test in combination with an HPV test.  Discuss your test results, treatment options, and if necessary, the need for more tests with your health care provider. Vaccines Your health care provider may recommend certain vaccines, such as:  Influenza vaccine. This is recommended every year.  Tetanus, diphtheria, and acellular pertussis (Tdap, Td) vaccine. You may need a Td booster every 10 years.  Varicella vaccine. You may need this if you have not been vaccinated.  HPV vaccine. If you are 71 or younger, you may need three doses over 6 months.  Measles, mumps, and rubella (MMR) vaccine. You may need at least one dose of MMR. You may also need a second dose.  Pneumococcal 13-valent conjugate (PCV13) vaccine. You may need this if you have certain conditions and were not previously vaccinated.  Pneumococcal polysaccharide (PPSV23) vaccine. You may need one or two doses if you smoke cigarettes or if you have certain conditions.  Meningococcal vaccine. One dose is recommended if you are age 95-21 years and a first-year college student living in a residence hall, or if you have one of several medical conditions. You may also need additional booster doses.  Hepatitis A vaccine. You may need this if you have certain conditions or if you travel or work in places where you may be exposed to hepatitis A.  Hepatitis B vaccine. You may need this if you have certain conditions or if you travel or work in places where you may be exposed to hepatitis B.  Haemophilus influenzae type b (Hib) vaccine. You may need this if you have certain risk factors.  Talk to your health care provider about which screenings and vaccines you need and how often you need them. This information is not intended to replace advice given to you by your health care provider. Make sure you discuss any questions you have with  your health care provider. Document Released: 05/26/2001 Document Revised: 12/18/2015 Document Reviewed: 01/29/2015 Elsevier Interactive Patient Education  [employer].

## 2017-09-18 LAB — NUSWAB VAGINITIS (VG)
CANDIDA ALBICANS, NAA: NEGATIVE
Candida glabrata, NAA: NEGATIVE
Trich vag by NAA: NEGATIVE

## 2017-09-20 NOTE — Progress Notes (Signed)
GYN ENCOUNTER NOTE  Subjective:       Janet Rivera is a 27 y.o. G1P1 female here for gynecologic evaluation of the following issues:  1. Medication check-Effexor current dose: 300 mg daily 2. Painful intercourse 3. Decreased libido 4. Fatigue  Endorses symptoms since birth of baby approximately one (1) year ago. Denies difficulty breathing or respiratory distress, chest pain, abdominal pain, excessive vaginal bleeding, dysuria, and leg pain or swelling.    Gynecologic History  Patient's last menstrual period was 09/15/2017 (exact date).  Contraception: OCP (estrogen/progesterone), Junel  Last Pap: 05/2016. Results were: normal  Obstetric History  OB History  Gravida Para Term Preterm AB Living  1 0 0 0 0 0  SAB TAB Ectopic Multiple Live Births  0 0 0 0 0    # Outcome Date GA Lbr Len/2nd Weight Sex Delivery Anes PTL Lv  1 Gravida             Past Medical History:  Diagnosis Date  . Acid reflux   . Anxiety   . Depression     Past Surgical History:  Procedure Laterality Date  . NO PAST SURGERIES      Current Outpatient Medications on File Prior to Visit  Medication Sig Dispense Refill  . LORazepam (ATIVAN) 0.5 MG tablet Take by mouth.    . Norethindrone Acetate-Ethinyl Estradiol (JUNEL,LOESTRIN,MICROGESTIN) 1.5-30 MG-MCG tablet Take 1 tablet daily by mouth. 1 Package 11  . pantoprazole (PROTONIX) 40 MG tablet Take by mouth.    Marland Kitchen. ibuprofen (ADVIL,MOTRIN) 600 MG tablet TK 1 T PO Q 6 H  0  . Prenatal Vit-Fe Fumarate-FA (PRENATAL MULTIVITAMIN) TABS tablet Take 1 tablet by mouth daily at 12 noon.    . venlafaxine XR (EFFEXOR XR) 75 MG 24 hr capsule Take 3 capsules (225 mg total) by mouth daily with breakfast. (Patient not taking: Reported on 09/16/2017) 90 capsule 1   No current facility-administered medications on file prior to visit.     No Known Allergies  Social History   Socioeconomic History  . Marital status: Married    Spouse name: Not on file  .  Number of children: Not on file  . Years of education: Not on file  . Highest education level: Not on file  Occupational History  . Not on file  Social Needs  . Financial resource strain: Not on file  . Food insecurity:    Worry: Not on file    Inability: Not on file  . Transportation needs:    Medical: Not on file    Non-medical: Not on file  Tobacco Use  . Smoking status: Never Smoker  . Smokeless tobacco: Never Used  Substance and Sexual Activity  . Alcohol use: No  . Drug use: No  . Sexual activity: Yes    Birth control/protection: None  Lifestyle  . Physical activity:    Days per week: Not on file    Minutes per session: Not on file  . Stress: Not on file  Relationships  . Social connections:    Talks on phone: Not on file    Gets together: Not on file    Attends religious service: Not on file    Active member of club or organization: Not on file    Attends meetings of clubs or organizations: Not on file    Relationship status: Not on file  . Intimate partner violence:    Fear of current or ex partner: Not on file    Emotionally abused:  Not on file    Physically abused: Not on file    Forced sexual activity: Not on file  Other Topics Concern  . Not on file  Social History Narrative  . Not on file    Family History  Problem Relation Age of Onset  . Thyroid disease Mother   . Rheum arthritis Father   . Diabetes Father   . Diabetes Maternal Grandmother   . Breast cancer Paternal Grandmother   . Rheum arthritis Paternal Grandfather     The following portions of the patient's history were reviewed and updated as appropriate: allergies, current medications, past family history, past medical history, past social history, past surgical history and problem list.  Review of Systems  ROS negative except as noted above. Information obtained from patient.   Objective:   BP 121/88   Pulse 85   Ht 5\' 3"  (1.6 m)   Wt 188 lb 4 oz (85.4 kg)   LMP 09/15/2017  (Exact Date)   BMI 33.35 kg/m    CONSTITUTIONAL: Well-developed, well-nourished female in no acute distress.    PELVIC:  External Genitalia: Normal  Vagina: Normal  Cervix: Normal  Uterus: Normal size, shape,consistency, mobile  Adnexa: Normal    Office Visit from 09/16/2017 in [facility]  PHQ-9 Total Score  12     Assessment:   1. Depression, unspecified depression type  - Ambulatory referral to Psychiatry  2. Anxiety state  - Ambulatory referral to Psychiatry  3. Dyspareunia in female  - CBC - Thyroid Panel With TSH - NuSwab Vaginitis (VG) - Ambulatory referral to Physical Therapy  4. Decreased libido  - NuSwab Vaginitis (VG) - Ambulatory referral to Physical Therapy  5. Other fatigue  - CBC - Thyroid Panel With TSH - Ambulatory referral to Physical Therapy   Plan:   Labs: NuSwab, TSH, and CBC; will contact patient with results.   Discussed home treatment measures for symptoms; handout given.   Rx: Effexor and Zantac, see orders.   Referral to Psychiatry and Pelvic floor therapy, see orders.   Reviewed red flag symptoms and when to call.   RTC x 6 months for Annual exam/medication check or sooner if needed.    Gunnar Bulla, CNM [facility]

## 2017-09-29 ENCOUNTER — Encounter: Payer: Self-pay | Admitting: Certified Nurse Midwife

## 2017-10-11 ENCOUNTER — Ambulatory Visit: Payer: BLUE CROSS/BLUE SHIELD | Admitting: Physical Therapy

## 2017-10-18 ENCOUNTER — Encounter: Payer: BLUE CROSS/BLUE SHIELD | Admitting: Physical Therapy

## 2017-10-20 ENCOUNTER — Encounter: Payer: Self-pay | Admitting: Physical Therapy

## 2017-10-20 ENCOUNTER — Ambulatory Visit: Payer: BLUE CROSS/BLUE SHIELD | Admitting: Physical Therapy

## 2017-10-20 ENCOUNTER — Other Ambulatory Visit: Payer: Self-pay

## 2017-10-20 ENCOUNTER — Ambulatory Visit: Payer: BLUE CROSS/BLUE SHIELD | Attending: Certified Nurse Midwife | Admitting: Physical Therapy

## 2017-10-20 DIAGNOSIS — M533 Sacrococcygeal disorders, not elsewhere classified: Secondary | ICD-10-CM

## 2017-10-20 DIAGNOSIS — M6281 Muscle weakness (generalized): Secondary | ICD-10-CM | POA: Diagnosis present

## 2017-10-20 DIAGNOSIS — R279 Unspecified lack of coordination: Secondary | ICD-10-CM | POA: Diagnosis present

## 2017-10-20 NOTE — Patient Instructions (Addendum)
  Decrease downward forces onto pelvic floor: Log roll out of bed instead of jumping out of bed with a crunch Hold off on crunches and sit ups  Exhale as you lift with proper squat alignment ( see mini squat instructions) and lift Henry with elbows closer to ribs   Minisquat:  ( every 1 hours break from sitting)   Scoot buttocks back slight, hinge like you are looking at your reflection on a pond   Knees behind toes,   Inhale to "smell flowers"   Exhale on the rise "like rocket"   Do not lock knees, have more weight across ballmounds of feet, toes relaxed    10 reps x 3 x day     or as you get out of the chair (against gravity and loads)     Avoid holding your breath when  Getting out of the chair:   Scoot to front part of chair chair  Heels behind feet, feet are hip width apart, nose over toes   Inhale like you are smelling roses  Exhale to stand   USE stand up desk every 2 hours  Sitting and standing posture with equal weight on both legs Sitting with both feet under knees, not crossed ankles nor thighs   Standing with knees unlocked, and switch between one foot in front of other with equal weight on both legs    ________   Hip strengthening and minimizing LOW BACK PAIN   Clam Shell 45 Degrees   Lying with hips and knees bent 45, one pillow between knees and ankles. Lift knee with exhale. Be sure pelvis does not roll backward. Do not arch back. Do 10 times, each leg, 2 times per day.  Complimentary stretch:  Figure-4     10 reps   Cross thigh over  10 reps   ____ Prone Heel Press for strengthening sacro-iliac joints  1. Lie on your belly. If you have an arch in your low back or it feels umcomfortable, place a pillow under your low belly/hips to make sure your low back feel comfortable.   2. Place our forehead on top of your palms.      Widen your knees apart for starting position.   3. Inhale, feel belly and low back expand  4. Exhale, feel belly hug in,  press heel together and count aloud for 5 sec. Then relax the heel squeezing.  Perform 10 reps of 5 sec holds. 2 sets/ day.    If you feel entire buttock tighten too much or feel low back pain, apply 50% less effort. As you press your heel together, you will feel as if your pubic bone (front of your pelvis) and sacrum (back of your pelvis) gentle move towards each other or your low abdominal muscles hug in more.

## 2017-10-21 NOTE — Therapy (Addendum)
[facility] MAIN [facility] SERVICES [address] [place], [place], [address] Phone: [phone]   Fax:  [phone]  Physical Therapy Evaluation  Patient Details  Name: Janet Rivera MRN: [id number] Date of Birth: October 15, 1990 No data recorded  Encounter Date: 10/20/2017  PT End of Session - 10/20/17 1826    Visit Number  1    Number of Visits  12    Date for PT Re-Evaluation  01/12/18    PT Start Time  1712    PT Stop Time  1826    PT Time Calculation (min)  74 min       Past Medical History:  Diagnosis Date  . Acid reflux   . Anxiety   . Depression     Past Surgical History:  Procedure Laterality Date  . NO PAST SURGERIES      There were no vitals filed for this visit.   Subjective Assessment - 10/20/17 1718    Subjective  1) pelvic pain: Pt gave birth to her first child in 12/2016. Pt had stitches with a perineal tear with 12 hours of contractions, 2 hours of intense pushing. Pt is no longer breast feeding.  Pt started having pelvic pain during menstrual cycles, pelvic exams,  wearing tampons, and sexual intercourse about 8 weeks later. Pt denied pelvic pain prior to pregnancy. Pt had HBP and pregnancy anemia during her full-term  pregnancy. Occupation requires long periods of sitting without breaks and has a stand -up desk but she does not use it. Physical activity: workout: 20 min cardio / treadmill/ stair stepper/ elliptical,  weight lifting 15-20 min UE, crunches/ sit-ups 20 x.     2) LBP: Pt had R lowerLBP during pregnancy to current. The pain is located in the middle of sacrum and upward to lowback. It is a constant pain 5/10. Denied radiating pain.  Pain increases with walking 2 miles or 30 min and during PMS. Pt did not have painful periods prior to pregnancy.    3) urinary leakage with laughing, coughing, sneezing.  Wears panty liners 4 x day.  4)       Patient Stated Goals  for LBP and pelvic pain to not hurt anymore           [facility] PT Assessment - 10/20/17 1750      Observation/Other Assessments   Observations  forward head, dowagers hump, L shoulder lower than R       Coordination   Gross Motor Movements are Fluid and Coordinated  -- ab overuse with pelvic floor contraction      Squat   Comments  anterior COM, poor alignment over knees       Sit to Stand   Comments  breathholding, poor mechanics      Other:   Other/ Comments  lifting baby from floor: deep squat , elbows apart with lifting, breathholding       Strength   Overall Strength Comments  hip abd 3-/5, hip ext 3+/5 B,      Palpation   Spinal mobility  increased hypomobility at thoracic spine     SI assessment   R tenderness at SIJ, and with R hip flexion     Palpation comment  R ASIS lower than L, FADDIR on R painful, 85 cm leg length B                 Objective measurements completed on examination: See above findings.      [facility]  Adult PT Treatment/Exercise - 10/20/17 1827      Therapeutic Activites    Therapeutic Activities  -- see pt instructions      Neuro Re-ed    Neuro Re-ed Details   see pt instructions      Manual Therapy   Manual therapy comments  long axis distraction on R, AP mob hip flexion. FADDER, FADDIR Grade II                   PT Long Term Goals - 10/21/17 0830      PT LONG TERM GOAL #1   Title  Pt will decrease her PDI score from 37% to <27% in order to return to ADLs     Time  12    Period  Weeks    Status  New    Target Date  01/13/18      PT LONG TERM GOAL #2   Title  Pt will report being able to insert and wear a tampon during her menstrual cycle without pain in order to particiapte in community activities    Time  8    Period  Weeks    Status  New    Target Date  12/16/17      PT LONG TERM GOAL #3   Title  PT will be IND and compliant with alternating between sitting and using stand up desk to increase blood flow and posture     Time  2    Period  Weeks     Status  New    Target Date  11/04/17      PT LONG TERM GOAL #4   Title  Pt will demo no pelvic obliquities and remain compliant with proper sitting posture , less crossing her legs, and less shifting onto one leg when holding baby in order to progress with deep core and pelvic floor strengthening    Time  4    Period  Weeks    Status  New    Target Date  11/18/17      PT LONG TERM GOAL #5   Title  Pt will demo proper pelvic floor lengthening, coordination, and Grade strength 3/3/3/ in order to have less pain during menstrual cycles and have to less incontinence.    Time  10    Period  Weeks    Status  New    Target Date  12/30/17      Additional Long Term Goals   Additional Long Term Goals  Yes      PT LONG TERM GOAL #6   Title  Pt will demo proper alignment and modifications techniques with functional activities (at home with baby, work, and  gym activities and weight lifting) to minimzie strain onto the pelvic floor     Time  6    Period  Weeks    Status  New    Target Date  12/02/17      PT LONG TERM GOAL #7   Title  Pt will decrease her ODI score from 14% to < 4% in order to return to weight lifting     Time  12    Period  Weeks    Status  New    Target Date  01/13/18             Plan - 10/21/17 1610    Clinical Impression Statement  Pt is a 27 yo female who reports pelvic pain, LBP, and urinary leakage which impacts her  ADLs, and QOL. Pelvic pain started after delivery of her first child 12/2016 which involved a long labor with excessive pushing and perineal tearing. Pelvic pain is associated with menstrual cycle, limits her from wearing tampons or tolerating pelvic gynecological exams and sexual intercourse. Localized LBP started during pregnancy and continues to persists constantly. Pt sits at work and does not use her stand up desk. Pt has returned to lifting weights and performs sit-ups and crunches in her work out routine. Pt's urinary leakage started after  pregnancy. Pt would benefit from skilled PT  To address these Sx.   Pt's clinical presentation showed pelvic obliquities and SIJ hypomobility, dyscoordination of pelvic floor and deep core mm, poor body mechanics in functional tasks ( sitting posture, lifting, bending) which place strain onto her pelvic floor mm. Following Tx today, pt reported a decrease in her R LBP from 5/10 to 3/10. Pt showed increased SIJ mobility and proper body mechanics techniques with lifting baby, sitting, standing. Encouraged pt to alternate and use her stand up desk and to perform HEP on breaks out of her chair. Educated pt on avoiding sit-up and crunches, lifting weight at this time with explanation of rationale.  PT voiced understanding.  Plan to address pelvic floor at upcoming sessions.     Clinical Presentation  Evolving    Clinical Decision Making  Moderate    Rehab Potential  Good    PT Frequency  1x / week    PT Duration  12 weeks    PT Treatment/Interventions  Patient/family education;Therapeutic activities;Moist Heat;Aquatic Therapy;Biofeedback;Therapeutic exercise;Balance training;Neuromuscular re-education;Manual techniques;Scar mobilization;Stair training;Taping    Consulted and Agree with Plan of Care  Patient       Patient will benefit from skilled therapeutic intervention in order to improve the following deficits and impairments:  Increased fascial restricitons, Increased muscle spasms, Decreased endurance, Decreased safety awareness, Decreased activity tolerance, Decreased balance, Decreased scar mobility, Improper body mechanics, Decreased mobility, Decreased strength, Impaired sensation, Postural dysfunction, Hypermobility, Pain, Decreased coordination  Visit Diagnosis: Muscle weakness (generalized)  Sacrococcygeal disorders, not elsewhere classified  Unspecified lack of coordination     Problem List Patient Active Problem List   Diagnosis Date Noted  . Amenorrhea 04/17/2016  . Ulcer,  stomach peptic 04/17/2016  . Anxiety state 07/04/2015  . Depression 01/17/2015    Mariane MastersYeung,Shin Yiing ,PT, DPT, E-RYT  10/21/2017, 8:40 AM  [place] [facility] [address] [place], [place], [address] Phone: [phone]   Fax:  [phone]  Name: Evette CristalVictoria Kinlaw-Sutton MRN: [id number] Date of Birth: 19-Jan-1991

## 2017-10-25 ENCOUNTER — Ambulatory Visit: Payer: BLUE CROSS/BLUE SHIELD | Admitting: Physical Therapy

## 2017-11-01 ENCOUNTER — Ambulatory Visit: Payer: BLUE CROSS/BLUE SHIELD | Admitting: Physical Therapy

## 2017-11-01 ENCOUNTER — Encounter: Payer: Self-pay | Admitting: Certified Nurse Midwife

## 2017-11-01 NOTE — Telephone Encounter (Signed)
Janet Rivera, I do not typical prescribe Ativan. Was she ever contact by Psychiatry after her referral in June? JML

## 2017-11-08 ENCOUNTER — Ambulatory Visit: Payer: BLUE CROSS/BLUE SHIELD | Admitting: Physical Therapy

## 2017-11-15 ENCOUNTER — Ambulatory Visit: Payer: BLUE CROSS/BLUE SHIELD | Admitting: Physical Therapy

## 2017-11-22 ENCOUNTER — Encounter: Payer: BLUE CROSS/BLUE SHIELD | Admitting: Physical Therapy

## 2017-12-16 ENCOUNTER — Other Ambulatory Visit: Payer: Self-pay | Admitting: Certified Nurse Midwife

## 2017-12-16 ENCOUNTER — Other Ambulatory Visit: Payer: Self-pay | Admitting: Obstetrics and Gynecology

## 2018-01-20 ENCOUNTER — Other Ambulatory Visit: Payer: Self-pay | Admitting: Certified Nurse Midwife

## 2018-01-24 ENCOUNTER — Other Ambulatory Visit: Payer: Self-pay

## 2018-01-24 MED ORDER — RANITIDINE HCL 150 MG PO TABS: 150.0000 mg | ORAL_TABLET | Freq: Two times a day (BID) | ORAL | 5 refills | Status: DC

## 2018-02-12 ENCOUNTER — Other Ambulatory Visit: Payer: Self-pay | Admitting: Certified Nurse Midwife

## 2018-03-18 ENCOUNTER — Other Ambulatory Visit: Payer: Self-pay | Admitting: Certified Nurse Midwife

## 2018-03-21 ENCOUNTER — Other Ambulatory Visit: Payer: Self-pay

## 2018-03-21 MED ORDER — VENLAFAXINE HCL ER 150 MG PO CP24: 300.0000 mg | ORAL_CAPSULE | Freq: Every day | ORAL | 5 refills | Status: DC

## 2018-03-21 MED ORDER — VENLAFAXINE HCL ER 37.5 MG PO CP24: 37.5000 mg | ORAL_CAPSULE | Freq: Every day | ORAL | 5 refills | Status: DC

## 2018-03-21 NOTE — Telephone Encounter (Signed)
Scripts sent to pharmacy 

## 2018-05-06 ENCOUNTER — Other Ambulatory Visit: Payer: Self-pay | Admitting: Certified Nurse Midwife

## 2018-05-11 ENCOUNTER — Other Ambulatory Visit: Payer: Self-pay

## 2018-05-11 MED ORDER — FERROUS SULFATE 325 (65 FE) MG PO TABS: 325.0000 mg | ORAL_TABLET | Freq: Every day | ORAL | 3 refills | Status: DC

## 2018-08-27 IMAGING — US US ABDOMEN LIMITED
1 series · 14 of 25 positions shown · non-contrast
Comparison: None in PACs

CLINICAL DATA: Right upper quadrant pain today since 10 a.m.. The
patient is 25 weeks pregnant.

EXAM:
ULTRASOUND ABDOMEN LIMITED RIGHT UPPER QUADRANT

[Series 1: us abdomen limited · 0.19mm/px · 14 of 42 slices shown]
[im 1/42]
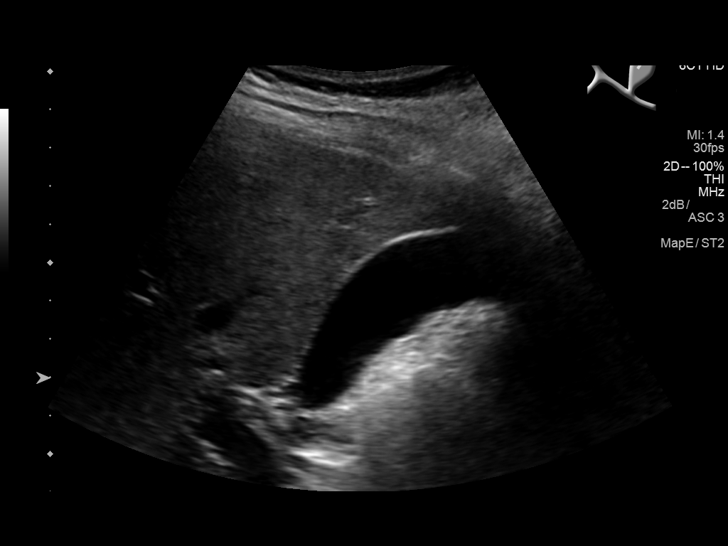
[im 4/42]
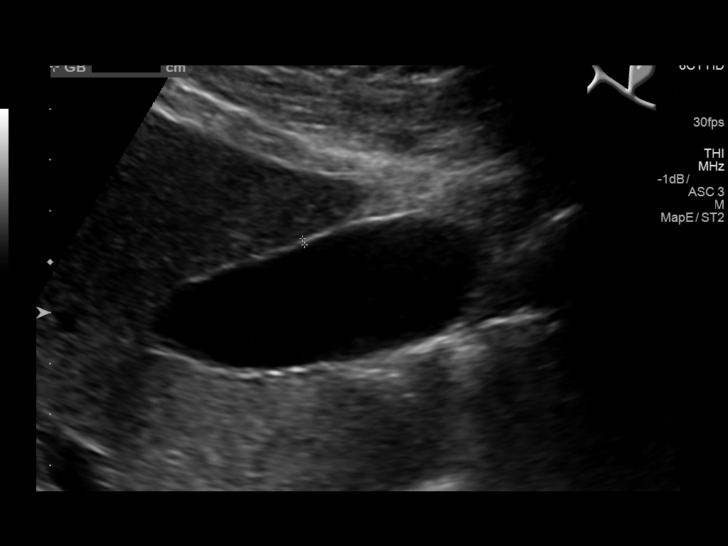
[im 7/42]
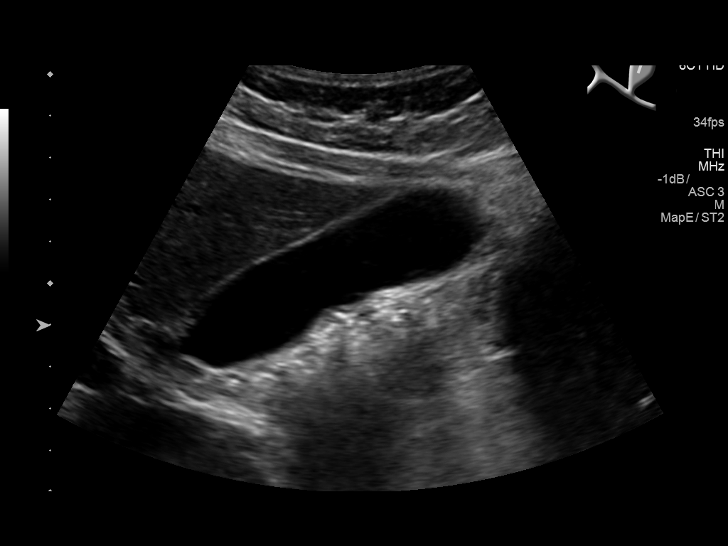
[im 11/42]
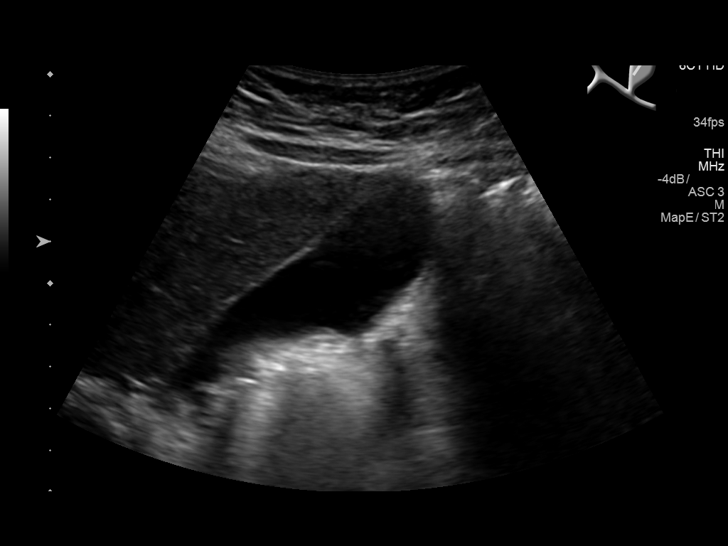
[im 14/42]
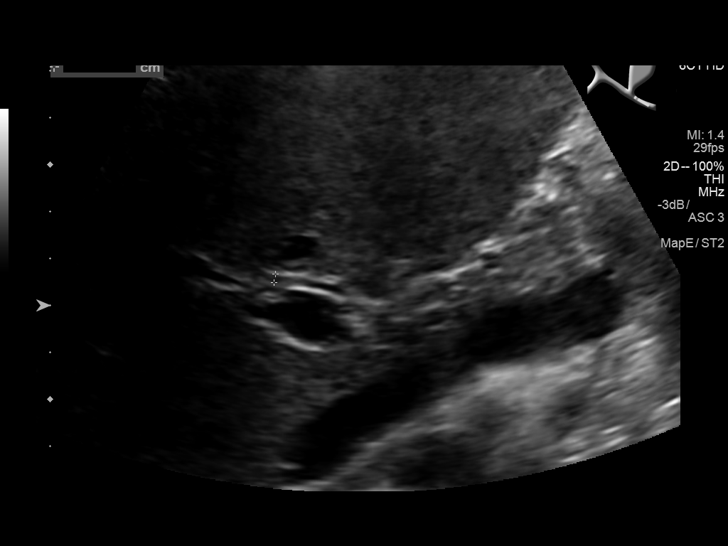
[im 16/42]
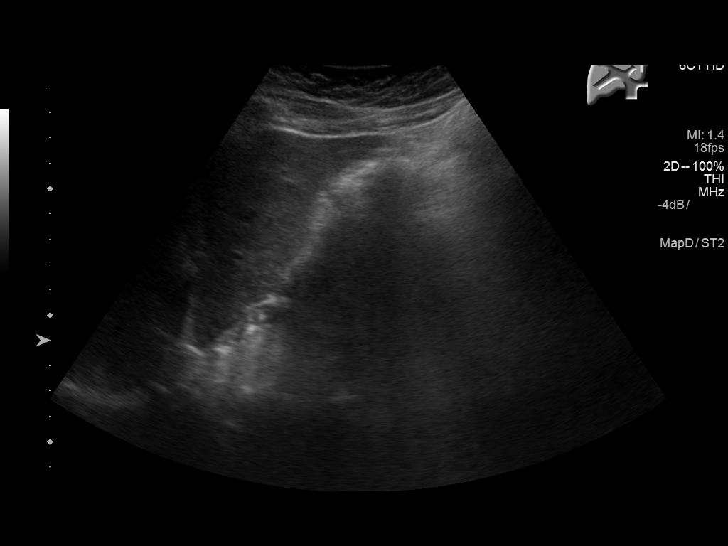
[im 19/42]
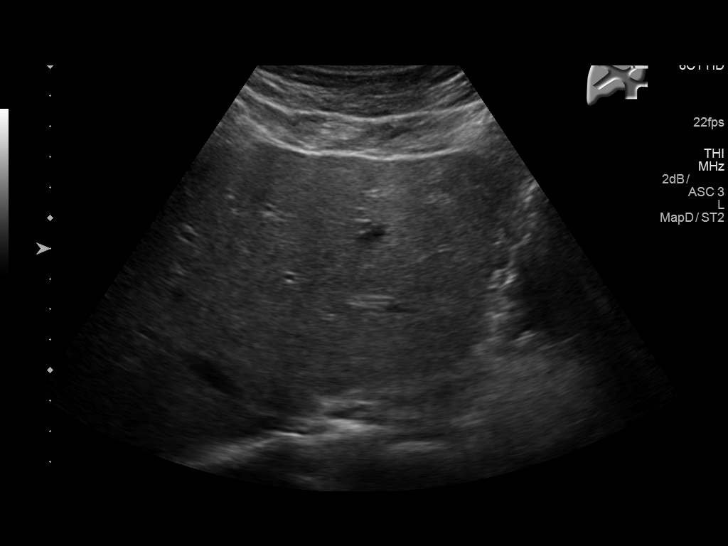
[im 23/42]
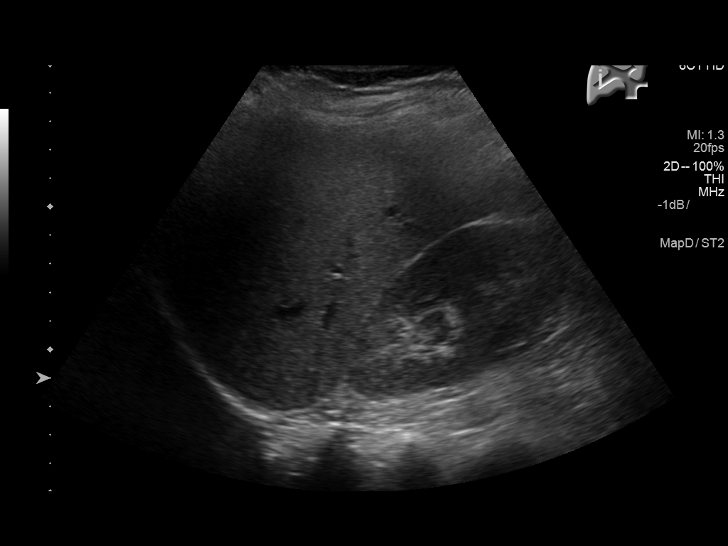
[im 26/42]
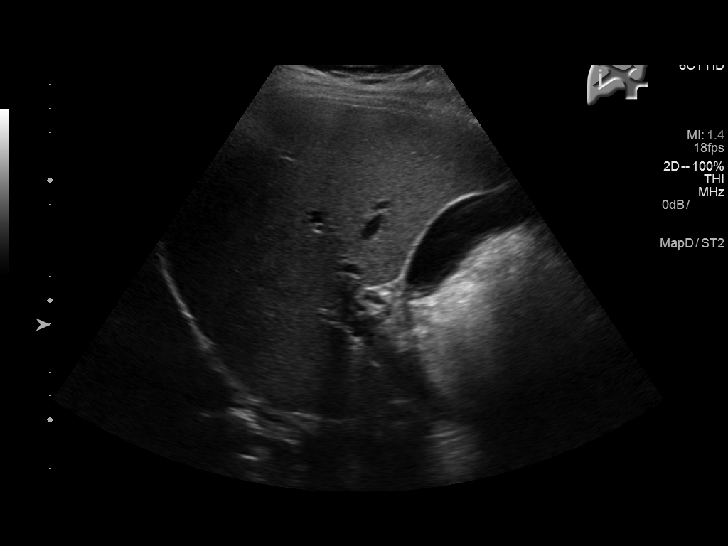
[im 28/42]
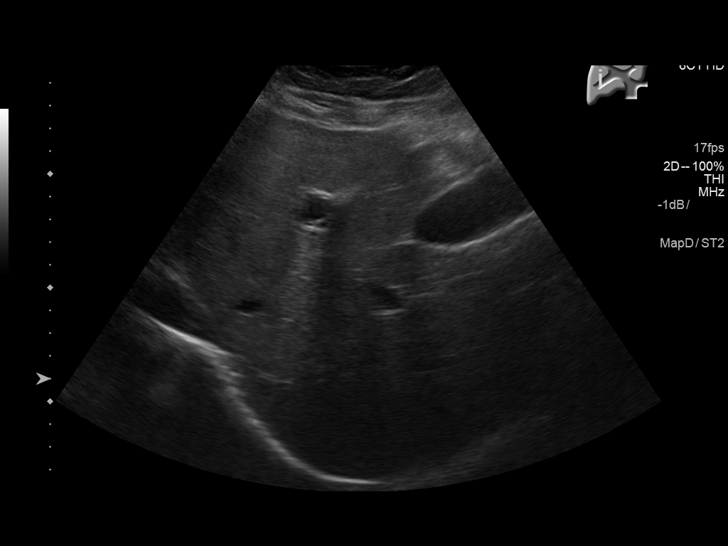
[im 31/42]
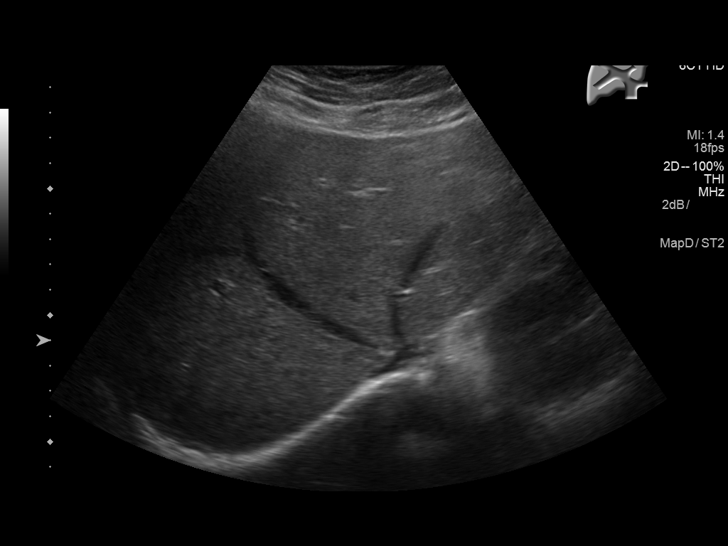
[im 35/42]
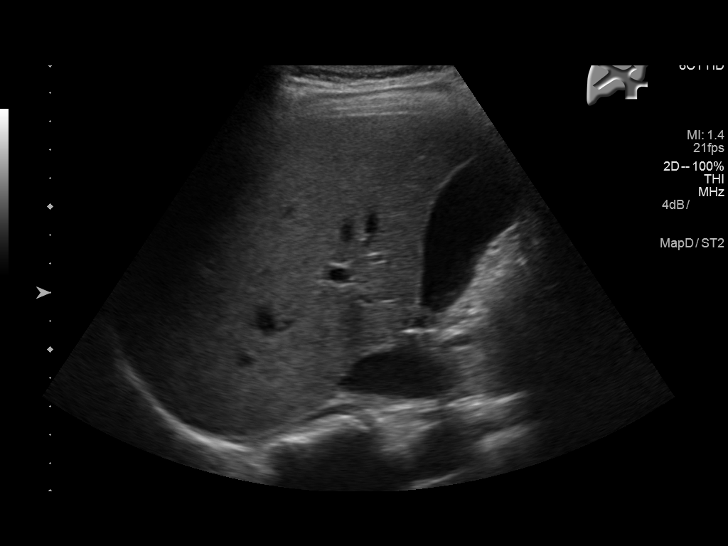
[im 38/42]
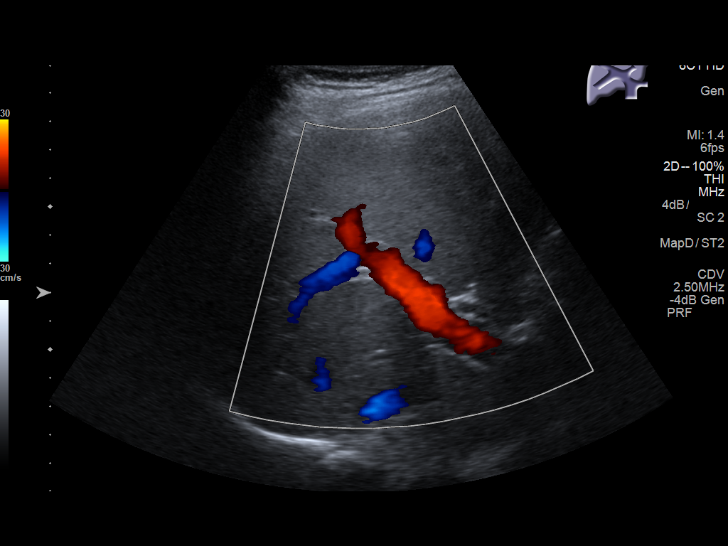
[im 42/42]
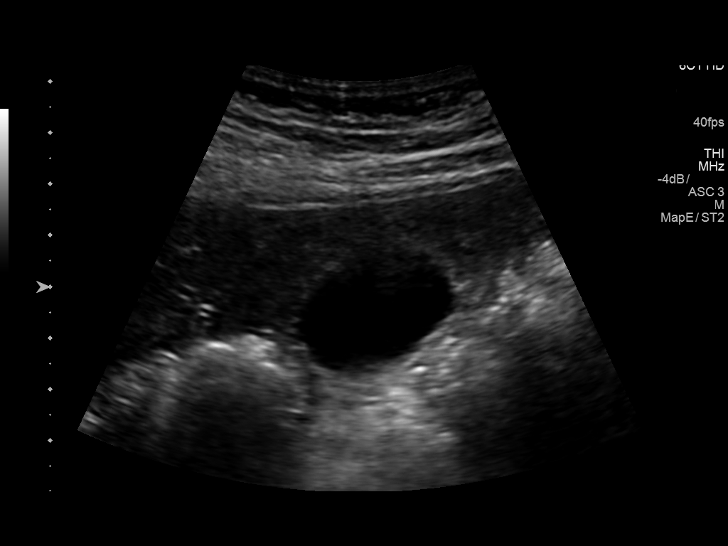

[14 of 25 positions shown; findings below may reference images not displayed]

FINDINGS: Gallbladder:

The gallbladder is adequately distended with no evidence of stones,
wall thickening, or pericholecystic fluid. There is no positive
sonographic Murphy's sign.

Common bile duct:

Diameter: 2.6 mm

Liver:

The hepatic echotexture is normal. There is no focal mass or ductal
dilation.
IMPRESSION: No gallstones or sonographic evidence of acute cholecystitis. Normal
appearance of the liver and common bile duct.

## 2018-11-28 DIAGNOSIS — G4719 Other hypersomnia: Secondary | ICD-10-CM | POA: Insufficient documentation

## 2018-12-06 ENCOUNTER — Ambulatory Visit: Payer: BLUE CROSS/BLUE SHIELD | Admitting: Podiatry

## 2018-12-23 ENCOUNTER — Ambulatory Visit: Payer: Self-pay | Admitting: Podiatry

## 2019-01-10 ENCOUNTER — Encounter: Payer: Self-pay | Admitting: Podiatry

## 2019-01-10 ENCOUNTER — Ambulatory Visit: Payer: Managed Care, Other (non HMO) | Admitting: Podiatry

## 2019-01-10 ENCOUNTER — Other Ambulatory Visit: Payer: Self-pay

## 2019-01-10 DIAGNOSIS — L600 Ingrowing nail: Secondary | ICD-10-CM | POA: Diagnosis not present

## 2019-01-10 MED ORDER — GENTAMICIN SULFATE 0.1 % EX CREA: 1.0000 "application " | TOPICAL_CREAM | Freq: Two times a day (BID) | CUTANEOUS | 1 refills | Status: AC

## 2019-01-10 NOTE — Patient Instructions (Signed)

## 2019-01-10 NOTE — Progress Notes (Signed)
   Subjective: Patient presents today for evaluation of pain to the lateral border of the left hallux that began about one month ago. Patient is concerned for possible ingrown nail. Touching the nail increases the pain. She has been soaking the toe in Epsom salt with no relief. Patient presents today for further treatment and evaluation.  Past Medical History:  Diagnosis Date  . Acid reflux   . Anxiety   . Depression     Objective:  General: Well developed, nourished, in no acute distress, alert and oriented x3   Dermatology: Skin is warm, dry and supple bilateral. Lateral border left hallux appears to be erythematous with evidence of an ingrowing nail. Pain on palpation noted to the border of the nail fold. The remaining nails appear unremarkable at this time. There are no open sores, lesions.  Vascular: Dorsalis Pedis artery and Posterior Tibial artery pedal pulses palpable. No lower extremity edema noted.   Neruologic: Grossly intact via light touch bilateral.  Musculoskeletal: Muscular strength within normal limits in all groups bilateral. Normal range of motion noted to all pedal and ankle joints.   Assesement: #1 Paronychia with ingrowing nail lateral border left hallux #2 Pain in toe #3 Incurvated nail  Plan of Care:  1. Patient evaluated.  2. Discussed treatment alternatives and plan of care. Explained nail avulsion procedure and post procedure course to patient. 3. Patient opted for permanent partial nail avulsion of the lateral border left hallux.  4. Prior to procedure, local anesthesia infiltration utilized using 3 ml of a 50:50 mixture of 2% plain lidocaine and 0.5% plain marcaine in a normal hallux block fashion and a betadine prep performed.  5. Partial permanent nail avulsion with chemical matrixectomy performed using [id number] applications of phenol followed by alcohol flush.  6. Light dressing applied. 7. Prescription for Gentamicin cream provided to patient to use  daily with a bandage.  8. Return to clinic in 2 weeks.  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   Edrick Kins, DPM [facility]  Dr. Edrick Kins, [address]                                        [place], [place] [address]                Office [phone]  Fax [phone]

## 2019-01-13 ENCOUNTER — Ambulatory Visit: Payer: Self-pay | Admitting: Podiatry

## 2019-01-31 ENCOUNTER — Ambulatory Visit: Payer: Managed Care, Other (non HMO) | Admitting: Podiatry

## 2019-02-24 ENCOUNTER — Ambulatory Visit: Payer: Managed Care, Other (non HMO) | Admitting: Podiatry

## 2019-02-26 ENCOUNTER — Encounter: Payer: Self-pay | Admitting: Podiatry

## 2019-03-02 ENCOUNTER — Encounter: Payer: Self-pay | Admitting: Podiatry

## 2019-03-02 ENCOUNTER — Other Ambulatory Visit: Payer: Self-pay

## 2019-03-02 ENCOUNTER — Ambulatory Visit (INDEPENDENT_AMBULATORY_CARE_PROVIDER_SITE_OTHER): Payer: Managed Care, Other (non HMO) | Admitting: Podiatry

## 2019-03-02 DIAGNOSIS — L03032 Cellulitis of left toe: Secondary | ICD-10-CM | POA: Diagnosis not present

## 2019-03-02 DIAGNOSIS — L600 Ingrowing nail: Secondary | ICD-10-CM

## 2019-03-02 DIAGNOSIS — M79672 Pain in left foot: Secondary | ICD-10-CM

## 2019-03-02 MED ORDER — DOXYCYCLINE HYCLATE 100 MG PO TABS: 100.0000 mg | ORAL_TABLET | Freq: Two times a day (BID) | ORAL | 0 refills | Status: AC

## 2019-03-06 ENCOUNTER — Encounter: Payer: Self-pay | Admitting: Podiatry

## 2019-03-06 NOTE — Progress Notes (Signed)
Subjective:  Patient ID: Janet Rivera, female    DOB: [birth year],  MRN: [id number]  Chief Complaint  Patient presents with  . Nail Problem    Patient comes in for infected ingrown toenail removal left great toe    28 y.o. female presents with the above complaint.  Patient presents with left ingrown paronychia which has not been healing.  Patient had a procedure done with Dr. Ruby Cola couple of months ago but the ingrown has not completely alleviated.  Patient states is painful to walk.  She has had multiple procedures done in the past 2 other big toes however this was the only one that is causing any issues.  She denies any other acute complaints.  She has done Epsom salt soaks she has been applying triple antibiotic and a Band-Aid over it.  She denies any other acute complaints.   Review of Systems: Negative except as noted in the HPI. Denies N/V/F/Ch.  Past Medical History:  Diagnosis Date  . Acid reflux   . Anxiety   . Depression     Current Outpatient Medications:  .  doxycycline (VIBRA-TABS) 100 MG tablet, Take 1 tablet (100 mg total) by mouth 2 (two) times daily., Disp: 20 tablet, Rfl: 0 .  gentamicin cream (GARAMYCIN) 0.1 %, Apply 1 application topically 2 (two) times daily., Disp: 15 g, Rfl: 1 .  LORazepam (ATIVAN) 0.5 MG tablet, Take by mouth., Disp: , Rfl:  .  Norethindrone Acetate-Ethinyl Estradiol (JUNEL,LOESTRIN,MICROGESTIN) 1.5-30 MG-MCG tablet, Take 1 tablet daily by mouth., Disp: 1 Package, Rfl: 11 .  ondansetron (ZOFRAN-ODT) 4 MG disintegrating tablet, , Disp: , Rfl:  .  sertraline (ZOLOFT) 100 MG tablet, , Disp: , Rfl:   Social History   Tobacco Use  Smoking Status Never Smoker  Smokeless Tobacco Never Used    No Known Allergies Objective:  There were no vitals filed for this visit. There is no height or weight on file to calculate BMI. Constitutional Well developed. Well nourished.  Vascular Dorsalis pedis pulses palpable bilaterally. Posterior  tibial pulses palpable bilaterally. Capillary refill normal to all digits.  No cyanosis or clubbing noted. Pedal hair growth normal.  Neurologic Normal speech. Oriented to person, place, and time. Epicritic sensation to light touch grossly present bilaterally.  Dermatologic Painful ingrowing nail at lateral nail borders of the hallux nail left. No other open wounds. No skin lesions.  Orthopedic: Normal joint ROM without pain or crepitus bilaterally. No visible deformities. No bony tenderness.   Radiographs: None Assessment:  No diagnosis found. Plan:  Patient was evaluated and treated and all questions answered.  Ingrown Nail, left -Patient elects to proceed with minor surgery to remove ingrown toenail removal today. Consent reviewed and signed by patient. -Ingrown nail excised. See procedure note. -Educated on post-procedure care including soaking. Written instructions provided and reviewed. -Patient to follow up in 2 weeks for nail check. -Doxycycline was dispensed for paronychia.  Procedure: Excision of Ingrown Toenail Location: Left 1st toe lateral nail borders. Anesthesia: Lidocaine 1% plain; 1.5 mL and Marcaine 0.5% plain; 1.5 mL, digital block. Skin Prep: Betadine. Dressing: Silvadene; telfa; dry, sterile, compression dressing. Technique: Following skin prep, the toe was exsanguinated and a tourniquet was secured at the base of the toe. The affected nail border was freed, split with a nail splitter, and excised. Chemical matrixectomy was then performed with phenol and irrigated out with alcohol. The tourniquet was then removed and sterile dressing applied. Disposition: Patient tolerated procedure well. Patient to return in 2 weeks  for follow-up.   No follow-ups on file.

## 2019-03-16 ENCOUNTER — Other Ambulatory Visit: Payer: Self-pay

## 2019-03-16 ENCOUNTER — Encounter: Payer: Self-pay | Admitting: Podiatry

## 2019-03-16 ENCOUNTER — Ambulatory Visit (INDEPENDENT_AMBULATORY_CARE_PROVIDER_SITE_OTHER): Payer: Managed Care, Other (non HMO) | Admitting: Podiatry

## 2019-03-16 DIAGNOSIS — M79672 Pain in left foot: Secondary | ICD-10-CM | POA: Diagnosis not present

## 2019-03-16 DIAGNOSIS — L600 Ingrowing nail: Secondary | ICD-10-CM

## 2019-03-16 DIAGNOSIS — L03032 Cellulitis of left toe: Secondary | ICD-10-CM

## 2019-03-17 ENCOUNTER — Encounter: Payer: Self-pay | Admitting: Podiatry

## 2019-03-17 NOTE — Progress Notes (Signed)
Subjective: Janet Rivera is a 28 y.o. female returns to office today for follow up evaluation after having left first lateral nail border hallux nail avulsion performed. Patient has been soaking using Epson salt and applying topical antibiotic covered with bandaid daily. Patient denies fevers, chills, nausea, vomiting. Denies any calf pain, chest pain, SOB.   Objective:  Vitals: Reviewed  General: Well developed, nourished, in no acute distress, alert and oriented x3   Dermatology: Skin is warm, dry and supple bilateral.  Lateral left hallux nail border appears to be clean, dry, with mild granular tissue and surrounding scab. There is no surrounding erythema, edema, drainage/purulence. The remaining nails appear unremarkable at this time. There are no other lesions or other signs of infection present.  Neurovascular status: Intact. No lower extremity swelling; No pain with calf compression bilateral.  Musculoskeletal: Decreased tenderness to palpation of the left lateral hallux nail fold(s). Muscular strength within normal limits bilateral.   Assesement and Plan: S/p partial nail avulsion, doing well.   -Continue soaking in epsom salts twice a day followed by antibiotic ointment and a band-aid. Can leave uncovered at night. Continue this until completely healed.  -If the area has not healed in 2 weeks, call the office for follow-up appointment, or sooner if any problems arise.  -Monitor for any signs/symptoms of infection. Call the office immediately if any occur or go directly to the emergency room. Call with any questions/concerns.  Boneta Lucks, DPM

## 2021-11-20 ENCOUNTER — Other Ambulatory Visit: Payer: Self-pay

## 2021-11-20 DIAGNOSIS — K0889 Other specified disorders of teeth and supporting structures: Secondary | ICD-10-CM | POA: Insufficient documentation

## 2021-11-20 NOTE — ED Triage Notes (Addendum)
Pt comes POV c/o dental pain. Pt was told today that she had a dental infection, pt taking amoxillin and has root canal scheduled for monday. Pt states pain started 4am this morning. Pt was prescribed pain medication and has been taking them with no relief.

## 2021-11-21 ENCOUNTER — Emergency Department
Admission: EM | Admit: 2021-11-21 | Discharge: 2021-11-21 | Disposition: A | Discharge status: Home / Self Care | Payer: BLUE CROSS/BLUE SHIELD | Attending: Emergency Medicine | Admitting: Emergency Medicine

## 2021-11-21 DIAGNOSIS — K0889 Other specified disorders of teeth and supporting structures: Secondary | ICD-10-CM

## 2021-11-21 MED ORDER — LIDOCAINE VISCOUS HCL 2 % MT SOLN
15.0000 mL | Freq: Once | OROMUCOSAL | Status: AC
  Administered 2021-11-21: 15 mL via OROMUCOSAL

## 2021-11-21 MED ORDER — OXYCODONE-ACETAMINOPHEN 5-325 MG PO TABS
2.0000 | ORAL_TABLET | Freq: Once | ORAL | Status: AC
  Administered 2021-11-21: 2 via ORAL
  Filled 2021-11-21: qty 2

## 2021-11-21 MED ORDER — MAGIC MOUTHWASH W/LIDOCAINE
5.0000 mL | Freq: Four times a day (QID) | ORAL | 0 refills | Status: AC | PRN
Start: 2021-11-21 — End: ?

## 2021-11-21 NOTE — ED Notes (Signed)
E-signature not working at this time. Pt verbalized understanding of D/C instructions, prescriptions and follow up care with no further questions at this time. Pt in NAD and ambulatory at time of D/C.  

## 2021-11-21 NOTE — ED Provider Notes (Signed)
[facility] Provider Note    Event Date/Time   First MD Initiated Contact with Patient 11/21/21 [phone]     (approximate)   History   Dental Pain   HPI  Laiken Sandy is a 31 y.o. female who presents for evaluation of dental pain.  She reports that she is going to have a root canal in 3 days with a local dental practice on 4 of the teeth in the middle front of her bottom teeth.  She just today was started on amoxicillin and Tylenol with codeine.  However she said that she feels like she is starting to swell and that the pain is uncontrolled with the medicine she was given as well as with ibuprofen.  She called her provider who says she might need to come to the emergency department for IV antibiotics.  She denies fever, difficulty speaking, difficulty swallowing, nausea, vomiting, chest pain, shortness of breath, and abdominal pain.  She just started the antibiotics earlier today.     Physical Exam   Triage Vital Signs: ED Triage Vitals  Enc Vitals Group     BP 11/20/21 2103 122/84     Pulse Rate 11/20/21 2103 87     Resp 11/20/21 2103 15     Temp 11/20/21 2103 98.7 F (37.1 C)     Temp Source 11/20/21 2103 Oral     SpO2 11/20/21 2103 99 %     Weight 11/20/21 2059 78 kg (172 lb)     Height 11/20/21 2059 1.575 m (5\' 2" )     Head Circumference --      Peak Flow --      Pain Score 11/20/21 2059 10     Pain Loc --      Pain Edu? --      Excl. in GC? --     Most recent vital signs: Vitals:   11/21/21 0139 11/21/21 0240  BP: 139/81 (!) 155/106  Pulse: 85 86  Resp: 18 18  Temp: 98.7 F (37.1 C) 98.7 F (37.1 C)  SpO2: 97% 97%     General: Awake, no distress.  Generally well-appearing. HEENT: Patient has no obvious sign of active dental infection at this time.  There is no evidence of significant gingivitis or ANUG, and no obvious broken teeth or signs of dental abscess.  I can appreciate no swelling of the jaw, gums, submandibular  space, nor swelling nor brawny induration beneath the tongue suggestive of Ludwig's angina.  Her voice is normal and she is tolerating her secretions, swallowing and speaking without difficulty. CV:  Good peripheral perfusion.  Resp:  Normal effort.  Abd:  No distention.  Other:  Mood and affect are normal.   ED Results / Procedures / Treatments     MEDICATIONS ORDERED IN ED: Medications  oxyCODONE-acetaminophen (PERCOCET/ROXICET) 5-325 MG per tablet 2 tablet (2 tablets Oral Given 11/21/21 0345)     IMPRESSION / MDM / ASSESSMENT AND PLAN / ED COURSE  I reviewed the triage vital signs and the nursing notes.                              Differential diagnosis includes, but is not limited to, dental caries, nonspecific dental pain, fracture, dental abscess, Ludwig's angina, gingivitis/ANUG.  Patient's presentation is most consistent with acute, uncomplicated illness.  Patient's physical exam is reassuring.  She has no evidence of swelling or concerning finding for abscess or spreading  facial cellulitis based on my physical exam.  Her vital signs are stable and within normal limits.  There is no benefit to imaging at this time.  I explained to the patient she needs to continue taking her oral antibiotic.  I gave her a dose of Percocet x 2 in the emergency department but also explained that she will need to keep taking the Tylenol with codeine as prescribed by her dentist as well as adjuvant ibuprofen.  I gave her prescription for Magic mouthwash in the hopes that that will also help to numb and soothe her mouth and teeth.  She states that she understands and agrees with plan.         FINAL CLINICAL IMPRESSION(S) / ED DIAGNOSES   Final diagnoses:  Pain, dental     Rx / DC Orders   ED Discharge Orders          Ordered    magic mouthwash w/lidocaine SOLN  4 times daily PRN       Note to Pharmacy: Please mix viscous lidocaine 2% with magic mouthwash solution so that the  lidocaine comprises approximately 25 % of the total solution.   11/21/21 0402             Note:  This document was prepared using Dragon voice recognition software and may include unintentional dictation errors.   Loleta Rose, MD 11/21/21 [phone]

## 2021-11-21 NOTE — Discharge Instructions (Addendum)
Please continue taking the antibiotics prescribed and the pain medicine prescribed by your dentist.  You can also take ibuprofen according to label instructions.  We also wrote your prescription for "Magic mouthwash" which you swish around and spit out; it might provide some topical numbing for the painful areas.  There is no indication that you need IV medications such as additional antibiotics at this time.  Please follow-up with your dentist at the next available opportunity.  If you develop new or worsening symptoms that concern you, please return to the nearest emergency department.

## 2022-01-19 ENCOUNTER — Encounter: Payer: Self-pay | Admitting: Intensive Care

## 2022-01-19 ENCOUNTER — Other Ambulatory Visit: Payer: Self-pay

## 2022-01-19 ENCOUNTER — Emergency Department: Payer: BLUE CROSS/BLUE SHIELD

## 2022-01-19 ENCOUNTER — Emergency Department
Admission: EM | Admit: 2022-01-19 | Discharge: 2022-01-19 | Disposition: A | Discharge status: Home / Self Care | Payer: BLUE CROSS/BLUE SHIELD | Attending: Emergency Medicine | Admitting: Emergency Medicine

## 2022-01-19 DIAGNOSIS — R079 Chest pain, unspecified: Secondary | ICD-10-CM | POA: Insufficient documentation

## 2022-01-19 DIAGNOSIS — Z5321 Procedure and treatment not carried out due to patient leaving prior to being seen by health care provider: Secondary | ICD-10-CM | POA: Insufficient documentation

## 2022-01-19 LAB — BASIC METABOLIC PANEL
Anion gap: 13 (ref 5–15)
BUN: 15 mg/dL (ref 6–20)
CO2: 20 mmol/L — ABNORMAL LOW (ref 22–32)
Calcium: 9.1 mg/dL (ref 8.9–10.3)
Chloride: 107 mmol/L (ref 98–111)
Creatinine, Ser: 0.92 mg/dL (ref 0.44–1.00)
GFR, Estimated: >60 mL/min (ref >60)
Glucose, Bld: 96 mg/dL (ref 70–99)
Potassium: 4.2 mmol/L (ref 3.5–5.1)
Sodium: 140 mmol/L (ref 135–145)

## 2022-01-19 LAB — CBC
HCT: 41.1 % (ref 36.0–46.0)
Hemoglobin: 13.3 g/dL (ref 12.0–15.0)
MCH: 27.9 pg (ref 26.0–34.0)
MCHC: 32.4 g/dL (ref 30.0–36.0)
MCV: 86.3 fL (ref 80.0–100.0)
Platelets: 355 10*3/uL (ref 150–400)
RBC: 4.76 MIL/uL (ref 3.87–5.11)
RDW: 13.2 % (ref 11.5–15.5)
WBC: 8.2 10*3/uL (ref 4.0–10.5)
nRBC: 0 % (ref 0.0–0.2)

## 2022-01-19 LAB — TROPONIN I (HIGH SENSITIVITY): Troponin I (High Sensitivity): 3 ng/L (ref <18)

## 2022-01-19 NOTE — ED Provider Triage Note (Signed)
Emergency Medicine Provider Triage Evaluation Note  Janet Rivera , a 31 y.o. female  was evaluated in triage.  Pt complains of right side chest pain for the past 3 days. No cardiac history.  Physical Exam  BP (!) 143/97 (BP Location: Right Arm)   Pulse 95   Temp 98.7 F (37.1 C) (Oral)   Resp 16   Ht 5\' 3"  (1.6 m)   Wt 72.6 kg   LMP 01/18/2022   SpO2 96%   BMI 28.34 kg/m  Gen:   Awake, no distress   Resp:  Normal effort  MSK:   Moves extremities without difficulty  Other:    Medical Decision Making  Medically screening exam initiated at 12:01 PM.  Appropriate orders placed.  Tamula Morrical was informed that the remainder of the evaluation will be completed by another provider, this initial triage assessment does not replace that evaluation, and the importance of remaining in the ED until their evaluation is complete.   Victorino Dike, FNP 01/19/22 1202

## 2022-01-19 NOTE — ED Notes (Signed)
Pt not answering for 2nd trop and vitals

## 2022-01-19 NOTE — ED Triage Notes (Signed)
Patient c/o right sided, stabbing chest pains for a few days.

## 2022-01-23 ENCOUNTER — Encounter: Payer: Self-pay | Admitting: Certified Nurse Midwife
